# Patient Record
Sex: Male | Born: 1980 | Race: White | Hispanic: No | Marital: Single | Smoking: Never smoker
Health system: Southern US, Community
[De-identification: ages and names within clinical notes are randomized; demographics above are authoritative.]

## PROBLEM LIST (undated history)

## (undated) DIAGNOSIS — F419 Anxiety disorder, unspecified: Secondary | ICD-10-CM

## (undated) DIAGNOSIS — F429 Obsessive-compulsive disorder, unspecified: Secondary | ICD-10-CM

## (undated) DIAGNOSIS — F840 Autistic disorder: Secondary | ICD-10-CM

## (undated) HISTORY — DX: Obsessive-compulsive disorder, unspecified: F42.9

## (undated) HISTORY — PX: MANDIBLE FRACTURE SURGERY: SHX706

---

## 2013-07-07 ENCOUNTER — Ambulatory Visit: Payer: Self-pay | Admitting: *Deleted

## 2013-07-13 ENCOUNTER — Encounter: Payer: Self-pay | Admitting: *Deleted

## 2013-07-13 ENCOUNTER — Encounter: Payer: Medicaid Other | Attending: Family Medicine | Admitting: *Deleted

## 2013-07-13 VITALS — Ht 69.25 in | Wt 224.1 lb

## 2013-07-13 DIAGNOSIS — Z713 Dietary counseling and surveillance: Secondary | ICD-10-CM | POA: Insufficient documentation

## 2013-07-13 DIAGNOSIS — R635 Abnormal weight gain: Secondary | ICD-10-CM

## 2013-07-13 NOTE — Patient Instructions (Signed)
Plan: Continue eating your healthy meals and snacks each day Continue choosing to be active every day by walking, working out, dancing and playing sports

## 2013-07-13 NOTE — Progress Notes (Signed)
  Medical Nutrition Therapy:  Appt start time: 1030 end time:  1130.  Assessment:  Primary concerns today: patient here fior weight loss. He has already lost 9 pounds since MD appointment 2 months ago. He states he is working out at the group home and playing sports like Marketing executive and soccer. He is interested in doing karate too. He is lifting weights a couple of times a week, he has PS3 exercised too. Walks outside often too. He states the recent weight gain was due to Trileptal, which he is now taking in a suspension form and is not gaining weight anymore.  Preferred Learning Style:   No preference indicated   Learning Readiness:   Ready  Change in progress  MEDICATIONS: see list   DIETARY INTAKE:  24-hr recall:  B ( AM): 2 slices of Gingerbread toasted with Cream Cheese, banana OR unsweet cereal with milk OR oatmeal with fruit OR English muffin with fruit, coffee with sweetener and creamer Snk ( AM): none  L ( PM): frozen dinner OR sandwich and raw vegetables with fat free Ranch dressing, flavored sugar free water  Snk ( PM): fresh fruit D ( PM): meat, starch, vegetables, bread, occasionally salad, unsweet ice tea or favored water Snk ( PM): cookies OR rice cakes OR jello / pudding Beverages: coffee, flavored water  Usual physical activity: He is lifting weights a couple of times a week, he has PS3 exercised too. Walks outside often too.    Estimated energy needs: 1600 calories 180 g carbohydrates 120 g protein 44 g fat    Intervention:  Nutrition counseling and assessment completed. He currently has appropriate eating habits and is physically active. Reviewed rationale of Plate Method and continuing to enjoy his vegetables as well as moderate portions of lean meat, fruits and starches.  Plan: Continue eating your healthy meals and snacks each day Continue choosing to be active every day by walking, working out, dancing and playing sports  Teaching Method Utilized:  Visual, Auditory and Hands on  Handouts given during visit include:  Plate Method handout  Barriers to learning/adherence to lifestyle change: mentally handicapped but appears aware of healthy food choices  Demonstrated degree of understanding via:  Teach Back   Monitoring/Evaluation:  Dietary intake, exercise, and body weight prn.

## 2013-07-22 ENCOUNTER — Encounter: Payer: Self-pay | Admitting: *Deleted

## 2014-11-28 ENCOUNTER — Ambulatory Visit: Payer: Medicaid Other | Attending: Internal Medicine | Admitting: Occupational Therapy

## 2014-11-28 ENCOUNTER — Encounter: Payer: Self-pay | Admitting: Occupational Therapy

## 2014-11-28 DIAGNOSIS — F819 Developmental disorder of scholastic skills, unspecified: Secondary | ICD-10-CM | POA: Insufficient documentation

## 2014-11-28 NOTE — Therapy (Signed)
[facility] [address] [place], [place], [address] Phone: [phone]   Fax:  [phone]  Occupational Therapy Evaluation  Patient Details  Name: Jonathan Bartlett MRN: [id number] Date of Birth: 1981/05/20 Referring Provider:  Colon Branch, MD  Encounter Date: 11/28/2014      OT End of Session - 11/28/14 1050    Visit Number 1   Authorization Type MCD   OT Start Time 1015   OT Stop Time 1045   OT Time Calculation (min) 30 min   Activity Tolerance Patient tolerated treatment well      Past Medical History  Diagnosis Date  . OCD (obsessive compulsive disorder)     History reviewed. No pertinent past surgical history.  There were no vitals filed for this visit.  Visit Diagnosis:  Delay of cognitive development - Plan: Ot plan of care cert/re-cert      Subjective Assessment - 11/28/14 1025    Subjective  No changes   Patient is accompained by: --  Group Home aide   Pertinent History OCD, mild mental delay   Currently in Pain? 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 Autism)   Onset Date --  congential   Precautions   Precautions --  requires distant supervision   Balance Screen   Has the patient fallen in the past 6 months No   Has the patient had a decrease in activity level because of a fear of falling?  No   Is the patient reluctant to leave their home because of a fear of falling?  No   Home  Environment   Family/patient expects to be discharged to: Group home   Lives With --  [facility]   Prior Function   Level of Independence Independent with basic ADLs  supervision for IADLS   Vocation --  N/A   ADL   ADL comments Pt performing all BADLS independently, performing IADLS and job tasks within [employer] [facility] with supervision   Mobility   Mobility Status Independent   Written Expression   Dominant Hand  Right   Vision - History   Baseline Vision Wears glasses all the time   Cognition   Overall Cognitive Status History of cognitive impairments - at baseline  mild MR, can follow 1 step commands, answer appropriately   Coordination   9 Hole Peg Test Right;Left   Right 9 Hole Peg Test 36.22 sec. (due to mild difficulty following 2 step directions, 1st attempt doing this task)   Left 9 Hole Peg Test 30.81 sec   ROM / Strength   AROM / PROM / Strength AROM;Strength   AROM   Overall AROM Comments BUE AROM WNL's   Strength   Overall Strength Comments BUE MMT grossly 5/5   Hand Function   Right Hand Grip (lbs) 115 lbs   Left Hand Grip (lbs) 99 lbs                                     Plan - 11/28/14 1050    Clinical Impression Statement Pt is a 34 y.o. male who presents to outpatient rehab for yearly O.T. evaluation from [facility]. Pt with mild cognitive delay/mild MR with no new changes. Pt is highly functioning group home resident.  Pt will benefit from skilled therapeutic intervention in order to improve on the following deficits (Retired) --  N/A   OT Frequency One time visit  Evaluation only   OT Treatment/Interventions --  N/A   Plan No f/u O.T. recommended at this time          G-Codes - 11/28/14 1054    Functional Assessment Tool Used FIM   Functional Limitation Self care   Self Care Current Status ([id number](G8987) 0 percent impaired, limited or restricted   Self Care Goal Status ([id number](G8988) 0 percent impaired, limited or restricted   Self Care Discharge Status [phone]) 0 percent impaired, limited or restricted      Problem List There are no active problems to display for this patient.   Kelli ChurnBallie, Savita Runner Johnson, OTR/L 11/28/2014, 10:58 AM  [facility] [address] [place], [place], [address] Phone: [phone]   Fax:  [phone]

## 2015-11-21 ENCOUNTER — Emergency Department (HOSPITAL_COMMUNITY)
Admission: EM | Admit: 2015-11-21 | Discharge: 2015-11-21 | Disposition: A | Discharge status: Home / Self Care | Payer: Medicaid Other | Attending: Emergency Medicine | Admitting: Emergency Medicine

## 2015-11-21 ENCOUNTER — Emergency Department (HOSPITAL_COMMUNITY): Payer: Medicaid Other

## 2015-11-21 ENCOUNTER — Encounter (HOSPITAL_COMMUNITY): Payer: Self-pay

## 2015-11-21 DIAGNOSIS — W19XXXA Unspecified fall, initial encounter: Secondary | ICD-10-CM

## 2015-11-21 DIAGNOSIS — S299XXA Unspecified injury of thorax, initial encounter: Secondary | ICD-10-CM | POA: Diagnosis present

## 2015-11-21 DIAGNOSIS — Y92199 Unspecified place in other specified residential institution as the place of occurrence of the external cause: Secondary | ICD-10-CM | POA: Insufficient documentation

## 2015-11-21 DIAGNOSIS — S2232XA Fracture of one rib, left side, initial encounter for closed fracture: Secondary | ICD-10-CM | POA: Insufficient documentation

## 2015-11-21 DIAGNOSIS — Y939 Activity, unspecified: Secondary | ICD-10-CM | POA: Insufficient documentation

## 2015-11-21 DIAGNOSIS — Y999 Unspecified external cause status: Secondary | ICD-10-CM | POA: Diagnosis not present

## 2015-11-21 DIAGNOSIS — Z79899 Other long term (current) drug therapy: Secondary | ICD-10-CM | POA: Insufficient documentation

## 2015-11-21 MED ORDER — NAPROXEN 500 MG PO TABS: 500.0000 mg | ORAL_TABLET | Freq: Two times a day (BID) | ORAL | Status: DC

## 2015-11-21 NOTE — ED Notes (Signed)
Pt out of bed to bathroom gets out of bed slowly but when erect, pt ambulates with fluid motion and without change in facial expression
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
## 2015-11-21 NOTE — ED Notes (Signed)
Pt in xray

## 2015-11-21 NOTE — ED Notes (Signed)
Call to Jake SharkHarold, [facility] who estimates that he will be here by 2140

## 2015-11-21 NOTE — Discharge Instructions (Signed)
As discussed, following a rib fracture it is normal to have pain for several days, but unless he develops new, or concerning changes, such as fever, inability to breathe, your symptoms will improve.  Please use the provided incentive spirometry device, as directed, 4 times daily for the next week
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
## 2019-06-16 ENCOUNTER — Emergency Department (HOSPITAL_COMMUNITY): Payer: Medicaid Other

## 2019-06-16 ENCOUNTER — Emergency Department (HOSPITAL_COMMUNITY)
Admission: EM | Admit: 2019-06-16 | Discharge: 2019-06-16 | Disposition: A | Discharge status: Home / Self Care | Payer: Medicaid Other | Attending: Emergency Medicine | Admitting: Emergency Medicine

## 2019-06-16 ENCOUNTER — Other Ambulatory Visit: Payer: Self-pay

## 2019-06-16 ENCOUNTER — Encounter (HOSPITAL_COMMUNITY): Payer: Self-pay

## 2019-06-16 DIAGNOSIS — S99812A Other specified injuries of left ankle, initial encounter: Secondary | ICD-10-CM | POA: Diagnosis present

## 2019-06-16 DIAGNOSIS — Y9389 Activity, other specified: Secondary | ICD-10-CM | POA: Diagnosis not present

## 2019-06-16 DIAGNOSIS — Y92199 Unspecified place in other specified residential institution as the place of occurrence of the external cause: Secondary | ICD-10-CM | POA: Diagnosis not present

## 2019-06-16 DIAGNOSIS — S82852A Displaced trimalleolar fracture of left lower leg, initial encounter for closed fracture: Secondary | ICD-10-CM | POA: Diagnosis not present

## 2019-06-16 DIAGNOSIS — Z79899 Other long term (current) drug therapy: Secondary | ICD-10-CM | POA: Insufficient documentation

## 2019-06-16 DIAGNOSIS — S098XXA Other specified injuries of head, initial encounter: Secondary | ICD-10-CM | POA: Diagnosis not present

## 2019-06-16 DIAGNOSIS — Y999 Unspecified external cause status: Secondary | ICD-10-CM | POA: Diagnosis not present

## 2019-06-16 DIAGNOSIS — F840 Autistic disorder: Secondary | ICD-10-CM | POA: Insufficient documentation

## 2019-06-16 HISTORY — DX: Anxiety disorder, unspecified: F41.9

## 2019-06-16 HISTORY — DX: Autistic disorder: F84.0

## 2019-06-16 LAB — BASIC METABOLIC PANEL
Anion gap: 10 (ref 5–15)
BUN: 15 mg/dL (ref 6–20)
CO2: 28 mmol/L (ref 22–32)
Calcium: 9.3 mg/dL (ref 8.9–10.3)
Chloride: 103 mmol/L (ref 98–111)
Creatinine, Ser: 0.94 mg/dL (ref 0.61–1.24)
GFR calc Af Amer: >60 mL/min (ref >60)
GFR calc non Af Amer: >60 mL/min (ref >60)
Glucose, Bld: 79 mg/dL (ref 70–99)
Potassium: 4.2 mmol/L (ref 3.5–5.1)
Sodium: 141 mmol/L (ref 135–145)

## 2019-06-16 LAB — CBC WITH DIFFERENTIAL/PLATELET
Abs Immature Granulocytes: 0.04 10*3/uL (ref 0.00–0.07)
Basophils Absolute: 0 10*3/uL (ref 0.0–0.1)
Basophils Relative: 0 %
Eosinophils Absolute: 0.1 10*3/uL (ref 0.0–0.5)
Eosinophils Relative: 1 %
HCT: 46.9 % (ref 39.0–52.0)
Hemoglobin: 15.6 g/dL (ref 13.0–17.0)
Immature Granulocytes: 0 %
Lymphocytes Relative: 6 %
Lymphs Abs: 0.7 10*3/uL (ref 0.7–4.0)
MCH: 32.6 pg (ref 26.0–34.0)
MCHC: 33.3 g/dL (ref 30.0–36.0)
MCV: 97.9 fL (ref 80.0–100.0)
Monocytes Absolute: 0.8 10*3/uL (ref 0.1–1.0)
Monocytes Relative: 6 %
Neutro Abs: 11.7 10*3/uL — ABNORMAL HIGH (ref 1.7–7.7)
Neutrophils Relative %: 87 %
Platelets: 177 10*3/uL (ref 150–400)
RBC: 4.79 MIL/uL (ref 4.22–5.81)
RDW: 11.9 % (ref 11.5–15.5)
WBC: 13.4 10*3/uL — ABNORMAL HIGH (ref 4.0–10.5)
nRBC: 0 % (ref 0.0–0.2)

## 2019-06-16 MED ORDER — FENTANYL CITRATE (PF) 100 MCG/2ML IJ SOLN
50.0000 ug | Freq: Once | INTRAMUSCULAR | Status: AC
  Administered 2019-06-16: 13:00:00 50 ug via INTRAVENOUS
  Filled 2019-06-16: qty 2

## 2019-06-16 MED ORDER — OXYCODONE-ACETAMINOPHEN 5-325 MG PO TABS
1.0000 | ORAL_TABLET | Freq: Once | ORAL | Status: AC
Start: 2019-06-16 — End: 2019-06-16
  Administered 2019-06-16: 14:00:00 1 via ORAL
  Filled 2019-06-16: qty 1

## 2019-06-16 MED ORDER — OXYCODONE-ACETAMINOPHEN 5-325 MG PO TABS: 1.0000 | ORAL_TABLET | ORAL | 0 refills | Status: DC | PRN

## 2019-06-16 MED ORDER — ETOMIDATE 2 MG/ML IV SOLN
10.0000 mg | Freq: Once | INTRAVENOUS | Status: AC
  Administered 2019-06-16: 10 mg via INTRAVENOUS
  Filled 2019-06-16: qty 10

## 2019-06-16 NOTE — ED Notes (Signed)
Pt is aware we need urine sample.  

## 2019-06-16 NOTE — ED Provider Notes (Signed)
Physical Exam  BP 101/72   Pulse 95   Temp 98 F (36.7 C)   Resp (!) 21   Ht 5\' 10"  (1.778 m)   Wt 77.1 kg   SpO2 100%   BMI 24.39 kg/m   Physical Exam  ED Course/Procedures     .Splint Application  Date/Time: 06/16/2019 3:21 PM Performed by: Isla Pence, MD Authorized by: Isla Pence, MD   Consent:    Consent obtained:  Written   Consent given by:  Guardian   Risks discussed:  Discoloration, numbness, pain and swelling   Alternatives discussed:  No treatment Pre-procedure details:    Sensation:  Normal Procedure details:    Laterality:  Left   Location:  Ankle   Ankle:  L ankle   Splint type:  Sugar tong   Supplies:  Cotton padding, Ortho-Glass and elastic bandage Post-procedure details:    Pain:  Improved   Sensation:  Normal   Patient tolerance of procedure:  Tolerated well, no immediate complications .Splint Application  Date/Time: 06/16/2019 3:21 PM Performed by: Isla Pence, MD Authorized by: Isla Pence, MD   Consent:    Consent obtained:  Written   Consent given by:  Guardian   Risks discussed:  Discoloration, numbness, pain and swelling   Alternatives discussed:  No treatment Pre-procedure details:    Sensation:  Normal Procedure details:    Laterality:  Left   Location:  Ankle   Ankle:  L ankle   Splint type:  Short leg   Supplies:  Elastic bandage, cotton padding and Ortho-Glass Post-procedure details:    Pain:  Improved   Sensation:  Normal   Patient tolerance of procedure:  Tolerated well, no immediate complications Reduction of fracture  Date/Time: 06/16/2019 3:22 PM Performed by: Isla Pence, MD Authorized by: Isla Pence, MD  Consent: Written consent obtained. Consent given by: guardian Patient understanding: patient states understanding of the procedure being performed Patient identity confirmed: verbally with patient Time out: Immediately prior to procedure a "time out" was called to verify the correct  patient, procedure, equipment, support staff and site/side marked as required. Preparation: Patient was prepped and draped in the usual sterile fashion. Local anesthesia used: no  Anesthesia: Local anesthesia used: no  Sedation: Patient sedated: yes Sedatives: fentanyl and etomidate Vitals: Vital signs were monitored during sedation.  Patient tolerance: patient tolerated the procedure well with no immediate complications  Reduction of dislocation  Date/Time: 06/16/2019 3:22 PM Performed by: Isla Pence, MD Authorized by: Isla Pence, MD  Consent: Written consent obtained. Consent given by: guardian Patient understanding: patient states understanding of the procedure being performed Required items: required blood products, implants, devices, and special equipment available Patient identity confirmed: verbally with patient Time out: Immediately prior to procedure a "time out" was called to verify the correct patient, procedure, equipment, support staff and site/side marked as required. Local anesthesia used: no  Anesthesia: Local anesthesia used: no  Sedation: Patient sedated: yes Sedatives: fentanyl and etomidate  Patient tolerance: patient tolerated the procedure well with no immediate complications  .Sedation  Date/Time: 06/16/2019 3:23 PM Performed by: Isla Pence, MD Authorized by: Isla Pence, MD   Consent:    Consent obtained:  Written   Consent given by:  Guardian   Alternatives discussed:  Analgesia without sedation Universal protocol:    Immediately prior to procedure a time out was called: yes     Patient identity confirmation method:  Verbally with patient Pre-sedation assessment:    Time since last food  or drink:  5   ASA classification: class 2 - patient with mild systemic disease     Neck mobility: normal     Mallampati score:  II - soft palate, uvula, fauces visible   Pre-sedation assessments completed and reviewed: airway patency,  cardiovascular function, hydration status, mental status, nausea/vomiting, pain level, respiratory function and temperature   Immediate pre-procedure details:    Reassessment: Patient reassessed immediately prior to procedure     Reviewed: vital signs     Verified: bag valve mask available, emergency equipment available, intubation equipment available, IV patency confirmed, oxygen available and reversal medications available   Procedure details (see MAR for exact dosages):    Preoxygenation:  Room air   Sedation:  Etomidate   Intended level of sedation: deep   Analgesia:  Fentanyl   Intra-procedure monitoring:  Blood pressure monitoring, cardiac monitor, continuous capnometry, continuous pulse oximetry, frequent LOC assessments and frequent vital sign checks   Intra-procedure events: none     Total Provider sedation time (minutes):  30 Post-procedure details:    Attendance: Constant attendance by certified staff until patient recovered     Recovery: Patient returned to pre-procedure baseline     Patient is stable for discharge or admission: yes     Patient tolerance:  Tolerated well, no immediate complications    MDM         Jacalyn Lefevre, MD 06/16/19 1524

## 2019-06-16 NOTE — ED Notes (Signed)
Pt's guardian is Otelia Santee- step dad- [phone]
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
## 2019-06-16 NOTE — Sedation Documentation (Signed)
Vital signs stable. 

## 2019-06-16 NOTE — ED Notes (Signed)
Discharge instructions given to [facility] group home by [place], [place] at this time. Group home staff member to return to ED to transport patient back today.
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
## 2019-06-16 NOTE — Sedation Documentation (Signed)
ED Provider at bedside. 

## 2019-06-16 NOTE — Discharge Instructions (Addendum)
No weightbearing to your left leg.  Use your crutches for walking and standing.  Keep your leg elevated and keep your splint dry.  Dr. Aline Brochure will see you in his office tomorrow, Friday, 06/17/2019.  You may call the office to arrange appointment time.

## 2019-06-17 ENCOUNTER — Telehealth: Payer: Self-pay | Admitting: Orthopedic Surgery

## 2019-06-17 ENCOUNTER — Ambulatory Visit (INDEPENDENT_AMBULATORY_CARE_PROVIDER_SITE_OTHER): Payer: Medicaid Other | Admitting: Orthopedic Surgery

## 2019-06-17 ENCOUNTER — Telehealth: Payer: Self-pay | Admitting: Radiology

## 2019-06-17 ENCOUNTER — Encounter: Payer: Self-pay | Admitting: Orthopedic Surgery

## 2019-06-17 VITALS — BP 118/75 | HR 77 | Ht 70.0 in | Wt 170.0 lb

## 2019-06-17 DIAGNOSIS — S82842A Displaced bimalleolar fracture of left lower leg, initial encounter for closed fracture: Secondary | ICD-10-CM

## 2019-06-17 NOTE — Telephone Encounter (Signed)
Dr Aline Brochure called his step father.

## 2019-06-17 NOTE — Patient Instructions (Signed)
Displaced Bimalleolar Ankle Fracture Treated With ORIF A bimalleolar fracture is two breaks (fractures) in the lower bones of the leg that help to form the ankle. These fractures are in:  The bottom end of the bone that you feel as the bump on the outer side of your ankle (fibula).  The bottom end of the bone that you feel as the bump on the inner side of your ankle (tibia). Open reduction with internal fixation (ORIF) is a surgical procedure that may be used to treat a bimalleolar fracture. You may need this surgery if your fracture is displaced, which means that the bones are not lined up correctly. During ORIF, a surgeon will move the bones back into the right position. The surgeon will put in a combination of screws, metal plates, or different types of wiring to hold the bones in place. Tell a health care provider about:  Any allergies you have.  All medicines you are taking, including vitamins, herbs, eye drops, creams, and over-the-counter medicines.  Any problems you or family members have had with anesthetic medicines.  Any blood disorders you have.  Any surgeries you have had.  Any medical conditions you have.  Whether you are pregnant or may be pregnant. What are the risks? Generally, this is a safe procedure. However, problems may occur, including:  Excessive bleeding.  Infection.  Allergic reactions to medicines.  Damage to nerves or blood vessels.  Failure of the fracture to heal.  Long-term pain and stiffness (arthritis).  Stiffness in the ankle after the repair. What happens before the procedure? Staying hydrated Follow instructions from your health care provider about hydration, which may include:  Up to 2 hours before the procedure - you may continue to drink clear liquids, such as water, clear fruit juice, black coffee, and plain tea. Eating and drinking restrictions Follow instructions from your health care provider about eating and drinking, which may  include:  8 hours before the procedure - stop eating heavy meals or foods such as meat, fried foods, or fatty foods.  6 hours before the procedure - stop eating light meals or foods, such as toast or cereal.  6 hours before the procedure - stop drinking milk or drinks that contain milk.  2 hours before the procedure - stop drinking clear liquids. Medicines  Ask your health care provider about: ? Changing or stopping your regular medicines. This is especially important if you are taking diabetes medicines or blood thinners. ? Taking medicines such as aspirin and ibuprofen. These medicines can thin your blood. Do not take these medicines unless your health care provider tells you to take them. ? Taking over-the-counter medicines, vitamins, herbs, and supplements.  You may be given antibiotic medicine to help prevent infection. General instructions  Plan to have someone take you home from the hospital or clinic.  Plan to have a responsible adult care for you for at least 24 hours after you leave the hospital or clinic. This is important.  Ask your health care provider how your surgical site will be marked or identified.  You may be asked to shower with a germ-killing soap. What happens during the procedure?  To lower your risk of infection: ? Your health care team will wash or sanitize their hands. ? Hair may be removed from the surgical area. ? Your skin will be washed with soap.  An IV will be inserted into one of your veins. You may be given antibiotic medicine and medicine for pain through the   IV.  You will be given one or more of the following: ? A medicine to numb the area (local anesthetic). ? A medicine to make you fall asleep (general anesthetic). ? A medicine that is injected into your spine to numb the area below and slightly above the injection site (spinal anesthetic). ? A medicine that is injected into an area of your body to numb everything below the injection site  (regional anesthetic).  The surgeon will make an incision through your skin over the area of the fractures.  The broken bones will be put back into their normal positions. The surgeon will use a combination of screws, metal plates, or different types of wiring to hold the bones in place.  After the bones are back in place, the surgeon will close the incision using stitches (sutures) or staples.  A bandage (dressing) and a splint, cast, or supportive boot will be placed over your ankle. The procedure may vary among health care providers and hospitals. What happens after the procedure?  Your blood pressure, heart rate, breathing rate, and blood oxygen level will be monitored until the medicines you were given have worn off.  You will be given medicine for pain as needed.  You may be given instructions about how much body weight you can or cannot safely support (bear) on your injured foot (weight-bearing restrictions). You may be given crutches, a cane, or a walker to help you move around so that you do not bear any weight on your foot.  While in the hospital, you will be helped out of bed so you can begin moving around. This is important to improve blood flow and breathing.  Do not drive for 24 hours if you were given a medicine to help you relax (sedative) during your procedure. Summary  A bimalleolar fracture is two breaks (fractures) in the lower bones of the leg (fibula and tibia) that help to form the ankle.  To repair the fractures with ORIF surgery, the surgeon will make an incision over the bones and move them back into place.  A combination of screws, metal plates, or wires will be used to permanently hold the bones in place.  Plan to have someone take you home after the procedure. Also, plan to have a responsible adult care for you for at least 24 hours after you leave the hospital or clinic. This information is not intended to replace advice given to you by your health care  provider. Make sure you discuss any questions you have with your health care provider. Document Released: 04/30/2005 Document Revised: 07/03/2017 Document Reviewed: 05/01/2017 Elsevier Patient Education  2020 [employer]  

## 2019-06-17 NOTE — Telephone Encounter (Addendum)
Step father wants you to call him about surgery, [phone] is phone number   ORIF ankle fracture 06/23/2019

## 2019-06-17 NOTE — Addendum Note (Signed)
Addended byCandice Camp on: 06/17/2019 10:58 AM   Modules accepted: Orders, SmartSet

## 2019-06-17 NOTE — Telephone Encounter (Signed)
The procedure has been fully reviewed with the patient; The risks and benefits of surgery have been discussed and explained and understood. Alternative treatment has also been reviewed, questions were encouraged and answered. The postoperative plan is also been reviewed.  I spoke to the guardian we discussed the surgery risk of infection risk of hardware removal  6 weeks of casting followed by 6 weeks of Cam walker up to 1 year of time needed for recovery  Guardian agrees to proceed with surgery
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
## 2019-06-21 ENCOUNTER — Other Ambulatory Visit (HOSPITAL_COMMUNITY)
Admission: RE | Admit: 2019-06-21 | Discharge: 2019-06-21 | Disposition: A | Discharge status: Home / Self Care | Payer: Medicaid Other | Source: Ambulatory Visit | Attending: Orthopedic Surgery | Admitting: Orthopedic Surgery

## 2019-06-21 ENCOUNTER — Encounter (HOSPITAL_COMMUNITY)
Admission: RE | Admit: 2019-06-21 | Discharge: 2019-06-21 | Disposition: A | Discharge status: Home / Self Care | Payer: Medicaid Other | Source: Ambulatory Visit | Attending: Orthopedic Surgery | Admitting: Orthopedic Surgery

## 2019-06-21 ENCOUNTER — Other Ambulatory Visit: Payer: Self-pay

## 2019-06-21 DIAGNOSIS — Z01812 Encounter for preprocedural laboratory examination: Secondary | ICD-10-CM | POA: Diagnosis present

## 2019-06-21 DIAGNOSIS — Z20828 Contact with and (suspected) exposure to other viral communicable diseases: Secondary | ICD-10-CM | POA: Insufficient documentation

## 2019-06-21 LAB — SARS CORONAVIRUS 2 (TAT 6-24 HRS): SARS Coronavirus 2: NEGATIVE

## 2019-06-22 ENCOUNTER — Encounter: Payer: Self-pay | Admitting: Orthopedic Surgery

## 2019-06-22 ENCOUNTER — Ambulatory Visit (INDEPENDENT_AMBULATORY_CARE_PROVIDER_SITE_OTHER): Payer: Medicaid Other | Admitting: Orthopedic Surgery

## 2019-06-22 ENCOUNTER — Other Ambulatory Visit: Payer: Self-pay

## 2019-06-22 DIAGNOSIS — S82842D Displaced bimalleolar fracture of left lower leg, subsequent encounter for closed fracture with routine healing: Secondary | ICD-10-CM
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
## 2019-06-22 NOTE — Patient Instructions (Signed)
Continue with instructions to be at the hospital tomorrow for surgery
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
## 2019-06-23 ENCOUNTER — Other Ambulatory Visit (HOSPITAL_COMMUNITY): Payer: Medicaid Other

## 2019-06-23 ENCOUNTER — Ambulatory Visit (HOSPITAL_COMMUNITY): Payer: Medicaid Other

## 2019-06-23 ENCOUNTER — Encounter (HOSPITAL_COMMUNITY)
Admission: RE | Disposition: A | Discharge status: Home / Self Care | Payer: Self-pay | Source: Home / Self Care | Attending: Orthopedic Surgery

## 2019-06-23 ENCOUNTER — Ambulatory Visit (HOSPITAL_COMMUNITY)
Admission: RE | Admit: 2019-06-23 | Discharge: 2019-06-23 | Disposition: A | Discharge status: Home / Self Care | Payer: Medicaid Other | Attending: Orthopedic Surgery | Admitting: Orthopedic Surgery

## 2019-06-23 ENCOUNTER — Ambulatory Visit (HOSPITAL_COMMUNITY): Payer: Medicaid Other | Admitting: Anesthesiology

## 2019-06-23 ENCOUNTER — Encounter (HOSPITAL_COMMUNITY): Payer: Self-pay | Admitting: Anesthesiology

## 2019-06-23 DIAGNOSIS — S82892D Other fracture of left lower leg, subsequent encounter for closed fracture with routine healing: Secondary | ICD-10-CM

## 2019-06-23 DIAGNOSIS — F419 Anxiety disorder, unspecified: Secondary | ICD-10-CM | POA: Diagnosis not present

## 2019-06-23 DIAGNOSIS — F840 Autistic disorder: Secondary | ICD-10-CM | POA: Diagnosis not present

## 2019-06-23 DIAGNOSIS — S82892A Other fracture of left lower leg, initial encounter for closed fracture: Secondary | ICD-10-CM

## 2019-06-23 DIAGNOSIS — X58XXXA Exposure to other specified factors, initial encounter: Secondary | ICD-10-CM | POA: Insufficient documentation

## 2019-06-23 DIAGNOSIS — Z91030 Bee allergy status: Secondary | ICD-10-CM | POA: Diagnosis not present

## 2019-06-23 DIAGNOSIS — S82842A Displaced bimalleolar fracture of left lower leg, initial encounter for closed fracture: Secondary | ICD-10-CM | POA: Insufficient documentation

## 2019-06-23 DIAGNOSIS — Z79899 Other long term (current) drug therapy: Secondary | ICD-10-CM | POA: Insufficient documentation

## 2019-06-23 HISTORY — PX: ORIF ANKLE FRACTURE: SHX5408

## 2019-06-23 SURGERY — OPEN REDUCTION INTERNAL FIXATION (ORIF) ANKLE FRACTURE
Anesthesia: General | Site: Ankle | Laterality: Left

## 2019-06-23 MED ORDER — MIDAZOLAM HCL 5 MG/5ML IJ SOLN
INTRAMUSCULAR | Status: DC | PRN
  Administered 2019-06-23: 2 mg via INTRAVENOUS

## 2019-06-23 MED ORDER — PROPOFOL 10 MG/ML IV BOLUS
INTRAVENOUS | Status: AC
  Filled 2019-06-23: qty 20

## 2019-06-23 MED ORDER — MEPERIDINE HCL 50 MG/ML IJ SOLN: 6.2500 mg | INTRAMUSCULAR | Status: DC | PRN

## 2019-06-23 MED ORDER — MIDAZOLAM HCL 2 MG/2ML IJ SOLN: 2.0000 mg | Freq: Once | INTRAMUSCULAR | Status: DC

## 2019-06-23 MED ORDER — ONDANSETRON HCL 4 MG/2ML IJ SOLN
4.0000 mg | Freq: Once | INTRAMUSCULAR | Status: AC | PRN
  Administered 2019-06-23: 4 mg via INTRAVENOUS
  Filled 2019-06-23: qty 2

## 2019-06-23 MED ORDER — DEXAMETHASONE SODIUM PHOSPHATE 4 MG/ML IJ SOLN
INTRAMUSCULAR | Status: AC
  Filled 2019-06-23: qty 2

## 2019-06-23 MED ORDER — DEXAMETHASONE SODIUM PHOSPHATE 10 MG/ML IJ SOLN
INTRAMUSCULAR | Status: DC | PRN
  Administered 2019-06-23: 10 mg via INTRAVENOUS

## 2019-06-23 MED ORDER — ONDANSETRON HCL 4 MG/2ML IJ SOLN
INTRAMUSCULAR | Status: DC | PRN
  Administered 2019-06-23: 4 mg via INTRAVENOUS

## 2019-06-23 MED ORDER — PHENYLEPHRINE HCL (PRESSORS) 10 MG/ML IV SOLN
INTRAVENOUS | Status: DC | PRN
  Administered 2019-06-23 (×2): 80 ug via INTRAVENOUS

## 2019-06-23 MED ORDER — BUPIVACAINE HCL (PF) 0.5 % IJ SOLN
INTRAMUSCULAR | Status: DC | PRN
  Administered 2019-06-23: 29 mL via PERINEURAL

## 2019-06-23 MED ORDER — FENTANYL CITRATE (PF) 250 MCG/5ML IJ SOLN
INTRAMUSCULAR | Status: AC
  Filled 2019-06-23: qty 5

## 2019-06-23 MED ORDER — LIDOCAINE HCL (PF) 1 % IJ SOLN
INTRAMUSCULAR | Status: DC | PRN
  Administered 2019-06-23: 3 mL

## 2019-06-23 MED ORDER — BUPIVACAINE HCL (PF) 0.5 % IJ SOLN
INTRAMUSCULAR | Status: AC
  Filled 2019-06-23: qty 30

## 2019-06-23 MED ORDER — FENTANYL CITRATE (PF) 100 MCG/2ML IJ SOLN: 100.0000 ug | Freq: Once | INTRAMUSCULAR | Status: DC

## 2019-06-23 MED ORDER — BUPIVACAINE-EPINEPHRINE (PF) 0.25% -1:200000 IJ SOLN
INTRAMUSCULAR | Status: AC
  Filled 2019-06-23: qty 60

## 2019-06-23 MED ORDER — BUPIVACAINE-EPINEPHRINE (PF) 0.25% -1:200000 IJ SOLN
INTRAMUSCULAR | Status: AC
  Filled 2019-06-23: qty 30

## 2019-06-23 MED ORDER — DEXAMETHASONE SODIUM PHOSPHATE 4 MG/ML IJ SOLN
INTRAMUSCULAR | Status: DC | PRN
  Administered 2019-06-23 (×2): 4 mg via PERINEURAL

## 2019-06-23 MED ORDER — CEFAZOLIN SODIUM-DEXTROSE 2-4 GM/100ML-% IV SOLN
INTRAVENOUS | Status: AC
  Filled 2019-06-23: qty 100

## 2019-06-23 MED ORDER — FENTANYL CITRATE (PF) 100 MCG/2ML IJ SOLN
INTRAMUSCULAR | Status: DC | PRN
  Administered 2019-06-23: 150 ug via INTRAVENOUS
  Administered 2019-06-23: 50 ug via INTRAVENOUS

## 2019-06-23 MED ORDER — LIDOCAINE 2% (20 MG/ML) 5 ML SYRINGE
INTRAMUSCULAR | Status: AC
  Filled 2019-06-23: qty 5

## 2019-06-23 MED ORDER — OXYCODONE-ACETAMINOPHEN 7.5-325 MG PO TABS: 1.0000 | ORAL_TABLET | ORAL | 0 refills | Status: AC | PRN

## 2019-06-23 MED ORDER — BUPIVACAINE-EPINEPHRINE (PF) 0.25% -1:200000 IJ SOLN
INTRAMUSCULAR | Status: DC | PRN
  Administered 2019-06-23: 17 mL via PERINEURAL

## 2019-06-23 MED ORDER — CHLORHEXIDINE GLUCONATE 4 % EX LIQD: 60.0000 mL | Freq: Once | CUTANEOUS | Status: DC

## 2019-06-23 MED ORDER — CEFAZOLIN SODIUM-DEXTROSE 2-4 GM/100ML-% IV SOLN
2.0000 g | INTRAVENOUS | Status: AC
  Administered 2019-06-23: 12:00:00 2 g via INTRAVENOUS

## 2019-06-23 MED ORDER — HYDROMORPHONE HCL 1 MG/ML IJ SOLN
0.2500 mg | INTRAMUSCULAR | Status: DC | PRN
  Administered 2019-06-23: 0.5 mg via INTRAVENOUS
  Filled 2019-06-23: qty 0.5

## 2019-06-23 MED ORDER — LIDOCAINE HCL (PF) 1 % IJ SOLN
INTRAMUSCULAR | Status: AC
  Filled 2019-06-23: qty 30

## 2019-06-23 MED ORDER — 0.9 % SODIUM CHLORIDE (POUR BTL) OPTIME
TOPICAL | Status: DC | PRN
  Administered 2019-06-23: 1000 mL

## 2019-06-23 MED ORDER — MIDAZOLAM HCL 2 MG/2ML IJ SOLN
INTRAMUSCULAR | Status: AC
  Filled 2019-06-23: qty 2

## 2019-06-23 MED ORDER — PROPOFOL 10 MG/ML IV BOLUS
INTRAVENOUS | Status: DC | PRN
  Administered 2019-06-23: 200 mg via INTRAVENOUS
  Administered 2019-06-23: 150 mg via INTRAVENOUS

## 2019-06-23 MED ORDER — LACTATED RINGERS IV SOLN
Freq: Once | INTRAVENOUS | Status: AC
  Administered 2019-06-23: 12:00:00 via INTRAVENOUS

## 2019-06-23 MED ORDER — IBUPROFEN 800 MG PO TABS: 800.0000 mg | ORAL_TABLET | Freq: Three times a day (TID) | ORAL | 0 refills | Status: DC | PRN

## 2019-06-23 SURGICAL SUPPLY — 55 items
BANDAGE ELASTIC 4 VELCRO NS (GAUZE/BANDAGES/DRESSINGS) ×6 IMPLANT
BANDAGE ESMARK 4X12 BL STRL LF (DISPOSABLE) ×1 IMPLANT
BLADE SURG SZ10 CARB STEEL (BLADE) ×3 IMPLANT
BNDG COHESIVE 4X5 TAN STRL (GAUZE/BANDAGES/DRESSINGS) ×3 IMPLANT
BNDG ELASTIC 3X5.8 VLCR NS LF (GAUZE/BANDAGES/DRESSINGS) ×3 IMPLANT
BNDG ELASTIC 4X5.8 VLCR NS LF (GAUZE/BANDAGES/DRESSINGS) ×6 IMPLANT
BNDG ESMARK 4X12 BLUE STRL LF (DISPOSABLE) ×3
CHLORAPREP W/TINT 26 (MISCELLANEOUS) ×6 IMPLANT
CLOTH BEACON ORANGE TIMEOUT ST (SAFETY) ×3 IMPLANT
COVER LIGHT HANDLE STERIS (MISCELLANEOUS) ×6 IMPLANT
COVER WAND RF STERILE (DRAPES) ×3 IMPLANT
CUFF TOURN SGL QUICK 34 (TOURNIQUET CUFF) ×2
CUFF TRNQT CYL 34X4.125X (TOURNIQUET CUFF) ×1 IMPLANT
DECANTER SPIKE VIAL GLASS SM (MISCELLANEOUS) ×6 IMPLANT
DRAPE C-ARM FOLDED MOBILE STRL (DRAPES) ×3 IMPLANT
DRAPE HALF SHEET 40X57 (DRAPES) ×3 IMPLANT
DRILL 2.6X122MM WL AO SHAFT (BIT) ×3 IMPLANT
GAUZE SPONGE 4X4 12PLY STRL (GAUZE/BANDAGES/DRESSINGS) ×3 IMPLANT
GAUZE XEROFORM 5X9 LF (GAUZE/BANDAGES/DRESSINGS) ×3 IMPLANT
GLOVE BIOGEL PI IND STRL 7.0 (GLOVE) ×2 IMPLANT
GLOVE BIOGEL PI INDICATOR 7.0 (GLOVE) ×4
GLOVE ECLIPSE 6.5 STRL STRAW (GLOVE) ×9 IMPLANT
GLOVE SKINSENSE NS SZ8.0 LF (GLOVE) ×2
GLOVE SKINSENSE STRL SZ8.0 LF (GLOVE) ×1 IMPLANT
GLOVE SS N UNI LF 8.5 STRL (GLOVE) ×3 IMPLANT
GOWN STRL REUS W/TWL LRG LVL3 (GOWN DISPOSABLE) ×6 IMPLANT
GOWN STRL REUS W/TWL XL LVL3 (GOWN DISPOSABLE) ×3 IMPLANT
INST SET MINOR BONE (KITS) ×3 IMPLANT
K-WIRE 1.6X150 (WIRE) ×2
K-WIRE FX150X1.6XKRSH (WIRE) ×2
KIT TURNOVER KIT A (KITS) ×3 IMPLANT
KWIRE ×6 IMPLANT
KWIRE FX150X1.6XKRSH (WIRE) ×2 IMPLANT
MANIFOLD NEPTUNE II (INSTRUMENTS) ×3 IMPLANT
NEEDLE HYPO 21X1.5 SAFETY (NEEDLE) ×3 IMPLANT
NS IRRIG 1000ML POUR BTL (IV SOLUTION) ×3 IMPLANT
PACK BASIC LIMB (CUSTOM PROCEDURE TRAY) ×3 IMPLANT
PAD ABD 5X9 TENDERSORB (GAUZE/BANDAGES/DRESSINGS) ×6 IMPLANT
PAD ARMBOARD 7.5X6 YLW CONV (MISCELLANEOUS) ×3 IMPLANT
PAD CAST 4YDX4 CTTN HI CHSV (CAST SUPPLIES) ×1 IMPLANT
PADDING CAST COTTON 4X4 STRL (CAST SUPPLIES) ×2
PLATE 7H 96MM (Plate) ×3 IMPLANT
SCREW 3.5X10MM (Screw) ×3 IMPLANT
SCREW BONE 3.5X16MM (Screw) ×3 IMPLANT
SCREW BONE 3.5X20MM (Screw) ×3 IMPLANT
SCREW BONE NON-LCKING 3.5X12MM (Screw) ×9 IMPLANT
SET BASIN LINEN APH (SET/KITS/TRAYS/PACK) ×3 IMPLANT
SPLINT J IMMOBILIZER 4X20FT (CAST SUPPLIES) ×1 IMPLANT
SPLINT J PLASTER J 4INX20Y (CAST SUPPLIES) ×2
SPONGE LAP 18X18 RF (DISPOSABLE) ×6 IMPLANT
STAPLER VISISTAT 35W (STAPLE) ×3 IMPLANT
SUT MON AB 0 CT1 (SUTURE) ×3 IMPLANT
SYR 30ML LL (SYRINGE) ×3 IMPLANT
SYR BULB IRRIGATION 50ML (SYRINGE) ×3 IMPLANT
WATER STERILE IRR 500ML POUR (IV SOLUTION) ×3 IMPLANT

## 2019-06-23 NOTE — Brief Op Note (Signed)
06/23/2019  1:43 PM  PATIENT:  Jonathan Bartlett  38 y.o. male  PRE-OPERATIVE DIAGNOSIS:  left ankle fracture bimolleolar  POST-OPERATIVE DIAGNOSIS:, Bimalleolar left ankle fracture  Stryker implants lateral plate and 6 screws medial K wires times two 1.6  Assisted by [facility] anesthesia with 2 blocks 1 posterior and 1 lateral  EBL minimal  Blood administered none  No drains  No extra injections were performed  PROCEDURE:  Procedure(s): OPEN REDUCTION INTERNAL FIXATION (ORIF) ANKLE FRACTURE (Left)  SURGEON:  Surgeon(s) and Role:    Carole Civil, MD - Primary  No specimens  Count correct  Tourniquet time 48 minutes   DICTATION: .Dragon Dictation  PLAN OF CARE: Discharge to home after PACU  PATIENT DISPOSITION:  PACU - hemodynamically stable.   Delay start of Pharmacological VTE agent (>24hrs) due to surgical blood loss or risk of bleeding: not applicable  Surgery was done as follows  The patient was seen in preop the surgical site was confirmed and marked his left ankle  Patient was taken to surgery had a block placed by anesthesia 1 posterior and 1 medially.  This was done after general anesthesia  He was in the supine position with 2 sandbags under the left hip for internal rotation of the leg and 3 blankets placed under the operative extremity  After sterile prep and drape timeout was completed limb was then exsanguinated with 4 inch Esmarch tourniquet inflated to 300 mmHg  The incision was made laterally over the malleolus and taken down to bone creating full-thickness skin flaps.  The fracture was irrigated debrided manually reduced and held with a pointed reduction clamp after which radiographs were taken.  Ankle mortise was restored as was length of the fibula.  Fracture was also reduced anatomically.  We then contoured a one third tubular plate placed at laterally and placed 6 screws starting closest to the fracture site on each hand  and then progressing away from the fracture  Radiographs confirm the reduction the ankle mortise restoration and the overall length of the fibula  We then turned our attention medially we made a straight incision over the medial fracture which was very small the subcutaneous tissue was divided the fracture site was debrided the ankle joint was irrigated the fracture was manually reduced and held with a pointed reduction clamp after which radiograph confirmed its reduction  We placed 2, 1.6 mm K wires  We checked x-ray confirmed reduction of the medial malleolar fracture  We then backed the pin out and then bent it and drove it back in and did the same with the second pin  We took x-rays and AP lateral and oblique in the fracture sites were reduced and the hardware was in good position  Both wounds were irrigated copiously with saline the medial wound was closed with 0 Monocryl and staples the lateral wound was closed with 0 Monocryl and staples  The tourniquet was then released   Good color and capillary refill noted dressings were applied sugar tong splint was applied foot was in neutral position at the ankle joint  Extubation and returned to recovery room in stable condition  3 weeks of no weightbearing in a cast and then convert to ankle CAM Walker  At 6 weeks he can weight-bear as tolerated Total time in the cam walker 12 weeks

## 2019-06-23 NOTE — Transfer of Care (Signed)
Immediate Anesthesia Transfer of Care Note  Patient: Jonathan Bartlett  Procedure(s) Performed: OPEN REDUCTION INTERNAL FIXATION (ORIF) ANKLE FRACTURE (Left Ankle)  Patient Location: PACU  Anesthesia Type:GA combined with regional for post-op pain  Level of Consciousness: awake, alert , oriented and patient cooperative  Airway & Oxygen Therapy: Patient Spontanous Breathing and Patient connected to face mask oxygen  Post-op Assessment: Report given to RN and Post -op Vital signs reviewed and stable  Post vital signs: Reviewed and stable  Last Vitals:  Vitals Value Taken Time  BP 124/89 06/23/19 1400  Temp    Pulse 91 06/23/19 1400  Resp 22 06/23/19 1400  SpO2 100 % 06/23/19 1400  Vitals shown include unvalidated device data.  Last Pain:  Vitals:   06/23/19 1100  TempSrc: Oral  PainSc: 0-No pain      Patients Stated Pain Goal: 4 (40/97/35 3299)  Complications: No apparent anesthesia complications

## 2019-06-23 NOTE — Discharge Instructions (Signed)
PATIENT INSTRUCTIONS POST-ANESTHESIA  IMMEDIATELY FOLLOWING SURGERY:  Do not drive or operate machinery for the first twenty four hours after surgery.  Do not make any important decisions for twenty four hours after surgery or while taking narcotic pain medications or sedatives.  If you develop intractable nausea and vomiting or a severe headache please notify your doctor immediately.  FOLLOW-UP:  Please make an appointment with your surgeon as instructed. You do not need to follow up with anesthesia unless specifically instructed to do so.  WOUND CARE INSTRUCTIONS (if applicable):  Keep a dry clean dressing on the anesthesia/puncture wound site if there is drainage.  Once the wound has quit draining you may leave it open to air.  Generally you should leave the bandage intact for twenty four hours unless there is drainage.  If the epidural site drains for more than 36-48 hours please call the anesthesia department.  QUESTIONS?:  Please feel free to call your physician or the hospital operator if you have any questions, and they will be happy to assist you.      Wound Care, Adult Taking care of your wound properly can help to prevent pain, infection, and scarring. It can also help your wound to heal more quickly. How to care for your wound Wound care      Follow instructions from your health care provider about how to take care of your wound. Make sure you: ? Wash your hands with soap and water before you change the bandage (dressing). If soap and water are not available, use hand sanitizer. ? Change your dressing as told by your health care provider. ? Leave stitches (sutures), skin glue, or adhesive strips in place. These skin closures may need to stay in place for 2 weeks or longer. If adhesive strip edges start to loosen and curl up, you may trim the loose edges. Do not remove adhesive strips completely unless your health care provider tells you to do that.  Check your wound area every  day for signs of infection. Check for: ? Redness, swelling, or pain. ? Fluid or blood. ? Warmth. ? Pus or a bad smell.  Ask your health care provider if you should clean the wound with mild soap and water. Doing this may include: ? Using a clean towel to pat the wound dry after cleaning it. Do not rub or scrub the wound. ? Applying a cream or ointment. Do this only as told by your health care provider. ? Covering the incision with a clean dressing.  Ask your health care provider when you can leave the wound uncovered.  Keep the dressing dry until your health care provider says it can be removed. Do not take baths, swim, use a hot tub, or do anything that would put the wound underwater until your health care provider approves. Ask your health care provider if you can take showers. You may only be allowed to take sponge baths. Medicines   If you were prescribed an antibiotic medicine, cream, or ointment, take or use the antibiotic as told by your health care provider. Do not stop taking or using the antibiotic even if your condition improves.  Take over-the-counter and prescription medicines only as told by your health care provider. If you were prescribed pain medicine, take it 30 or more minutes before you do any wound care or as told by your health care provider. General instructions  Return to your normal activities as told by your health care provider. Ask your health care  provider what activities are safe.  Do not scratch or pick at the wound.  Do not use any products that contain nicotine or tobacco, such as cigarettes and e-cigarettes. These may delay wound healing. If you need help quitting, ask your health care provider.  Keep all follow-up visits as told by your health care provider. This is important.  Eat a diet that includes protein, vitamin A, vitamin C, and other nutrient-rich foods to help the wound heal. ? Foods rich in protein include meat, dairy, beans, nuts, and  other sources. ? Foods rich in vitamin A include carrots and dark green, leafy vegetables. ? Foods rich in vitamin C include citrus, tomatoes, and other fruits and vegetables. ? Nutrient-rich foods have protein, carbohydrates, fat, vitamins, or minerals. Eat a variety of healthy foods including vegetables, fruits, and whole grains. Contact a health care provider if:  You received a tetanus shot and you have swelling, severe pain, redness, or bleeding at the injection site.  Your pain is not controlled with medicine.  You have redness, swelling, or pain around the wound.  You have fluid or blood coming from the wound.  Your wound feels warm to the touch.  You have pus or a bad smell coming from the wound.  You have a fever or chills.  You are nauseous or you vomit.  You are dizzy. Get help right away if:  You have a red streak going away from your wound.  The edges of the wound open up and separate.  Your wound is bleeding, and the bleeding does not stop with gentle pressure.  You have a rash.  You faint.  You have trouble breathing. Summary  Always wash your hands with soap and water before changing your bandage (dressing).  To help with healing, eat foods that are rich in protein, vitamin A, vitamin C, and other nutrients.  Check your wound every day for signs of infection. Contact your health care provider if you suspect that your wound is infected. This information is not intended to replace advice given to you by your health care provider. Make sure you discuss any questions you have with your health care provider. Document Released: 04/29/2008 Document Revised: 11/08/2018 Document Reviewed: 02/05/2016 Elsevier Patient Education  2020 [employer].

## 2019-06-23 NOTE — Anesthesia Postprocedure Evaluation (Signed)
Anesthesia Post Note  Patient: Jonathan Bartlett  Procedure(s) Performed: OPEN REDUCTION INTERNAL FIXATION (ORIF) ANKLE FRACTURE (Left Ankle)  Patient location during evaluation: PACU Anesthesia Type: General and Regional Level of consciousness: awake and alert, oriented and patient cooperative Pain management: pain level controlled Vital Signs Assessment: post-procedure vital signs reviewed and stable Respiratory status: spontaneous breathing and respiratory function stable Cardiovascular status: blood pressure returned to baseline and stable Postop Assessment: no headache, no backache, adequate PO intake and no apparent nausea or vomiting Anesthetic complications: no     Last Vitals:  Vitals:   06/23/19 1100 06/23/19 1354  Pulse: 80   Temp: 36.9 C (P) 37.7 C  SpO2:  (P) 100%    Last Pain:  Vitals:   06/23/19 1100  TempSrc: Oral  PainSc: 0-No pain                 Riniyah Speich

## 2019-06-23 NOTE — Anesthesia Procedure Notes (Signed)
Anesthesia Regional Block: Adductor canal block (saphenous block)   Pre-Anesthetic Checklist: ,, timeout performed, Correct Patient, Correct Site, Correct Laterality, Correct Procedure, Correct Position, site marked, Risks and benefits discussed,  Surgical consent,  Pre-op evaluation,  At surgeon's request and post-op pain management  Laterality: Left  Prep: chloraprep       Needles:  Injection technique: Single-shot  Needle Type: Echogenic Stimulator Needle     Needle Length: 10cm  Needle Gauge: 20   Needle insertion depth: 6 cm   Additional Needles:   Procedures:,,,, ultrasound used (permanent image in chart),,,,  Narrative:  Start time: 06/23/2019 12:20 PM End time: 06/23/2019 12:25 PM Injection made incrementally with aspirations every 18 mL.  Performed by: Personally  Anesthesiologist: Denese Killings, MD  Additional Notes: Bupivacaine 0.25% with epi 1:200000 with dexamethasone 4 mg - 18 ml

## 2019-06-23 NOTE — Op Note (Signed)
06/23/2019  1:43 PM  PATIENT:  Jonathan Bartlett  38 y.o. male  PRE-OPERATIVE DIAGNOSIS:  left ankle fracture bimolleolar  POST-OPERATIVE DIAGNOSIS:, Bimalleolar left ankle fracture  Stryker implants lateral plate and 6 screws medial K wires times two 1.6  Assisted by Debbie Dallas  General anesthesia with 2 blocks 1 posterior and 1 lateral  EBL minimal  Blood administered none  No drains  No extra injections were performed  PROCEDURE:  Procedure(s): OPEN REDUCTION INTERNAL FIXATION (ORIF) ANKLE FRACTURE (Left)  SURGEON:  Surgeon(s) and Role:    * Nichlas Pitera E, MD - Primary  No specimens  Count correct  Tourniquet time 48 minutes   DICTATION: .Dragon Dictation  PLAN OF CARE: Discharge to home after PACU  PATIENT DISPOSITION:  PACU - hemodynamically stable.   Delay start of Pharmacological VTE agent (>24hrs) due to surgical blood loss or risk of bleeding: not applicable  Surgery was done as follows  The patient was seen in preop the surgical site was confirmed and marked his left ankle  Patient was taken to surgery had a block placed by anesthesia 1 posterior and 1 medially.  This was done after general anesthesia  He was in the supine position with 2 sandbags under the left hip for internal rotation of the leg and 3 blankets placed under the operative extremity  After sterile prep and drape timeout was completed limb was then exsanguinated with 4 inch Esmarch tourniquet inflated to 300 mmHg  The incision was made laterally over the malleolus and taken down to bone creating full-thickness skin flaps.  The fracture was irrigated debrided manually reduced and held with a pointed reduction clamp after which radiographs were taken.  Ankle mortise was restored as was length of the fibula.  Fracture was also reduced anatomically.  We then contoured a one third tubular plate placed at laterally and placed 6 screws starting closest to the fracture site on each hand  and then progressing away from the fracture  Radiographs confirm the reduction the ankle mortise restoration and the overall length of the fibula  We then turned our attention medially we made a straight incision over the medial fracture which was very small the subcutaneous tissue was divided the fracture site was debrided the ankle joint was irrigated the fracture was manually reduced and held with a pointed reduction clamp after which radiograph confirmed its reduction  We placed 2, 1.6 mm K wires  We checked x-ray confirmed reduction of the medial malleolar fracture  We then backed the pin out and then bent it and drove it back in and did the same with the second pin  We took x-rays and AP lateral and oblique in the fracture sites were reduced and the hardware was in good position  Both wounds were irrigated copiously with saline the medial wound was closed with 0 Monocryl and staples the lateral wound was closed with 0 Monocryl and staples  The tourniquet was then released   Good color and capillary refill noted dressings were applied sugar tong splint was applied foot was in neutral position at the ankle joint  Extubation and returned to recovery room in stable condition  3 weeks of no weightbearing in a cast and then convert to ankle CAM Walker  At 6 weeks he can weight-bear as tolerated Total time in the cam walker 12 weeks  
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
## 2019-06-23 NOTE — Anesthesia Procedure Notes (Signed)
Anesthesia Regional Block: Popliteal block   Pre-Anesthetic Checklist: ,, timeout performed, Correct Patient, Correct Site, Correct Laterality, Correct Procedure, Correct Position, site marked, Risks and benefits discussed,  Surgical consent,  Pre-op evaluation,  At surgeon's request and post-op pain management  Laterality: Left  Prep: chloraprep       Needles:   Needle Type: Echogenic Stimulator Needle     Needle Length: 10cm  Needle Gauge: 20   Needle insertion depth: 6 cm   Additional Needles:   Procedures:,,,, ultrasound used (permanent image in chart),,,,  Narrative:  Start time: 06/23/2019 12:10 PM End time: 06/23/2019 12:18 PM Injection made incrementally with aspirations every 30 mL.  Performed by: Personally  Anesthesiologist: Denese Killings, MD  Additional Notes: Bupivacaine 0.5% with dexamethasone 4 mg - 30 ml, local infiltration with lidocaine 1% 3 ml

## 2019-06-24 ENCOUNTER — Encounter (HOSPITAL_COMMUNITY): Payer: Self-pay | Admitting: Orthopedic Surgery

## 2019-06-28 ENCOUNTER — Ambulatory Visit (INDEPENDENT_AMBULATORY_CARE_PROVIDER_SITE_OTHER): Payer: Medicaid Other | Admitting: Orthopedic Surgery

## 2019-06-28 ENCOUNTER — Encounter: Payer: Self-pay | Admitting: Orthopedic Surgery

## 2019-06-28 ENCOUNTER — Other Ambulatory Visit: Payer: Self-pay

## 2019-06-28 DIAGNOSIS — S82842D Displaced bimalleolar fracture of left lower leg, subsequent encounter for closed fracture with routine healing: Secondary | ICD-10-CM

## 2019-06-28 DIAGNOSIS — Z9889 Other specified postprocedural states: Secondary | ICD-10-CM

## 2019-06-28 DIAGNOSIS — Z8781 Personal history of (healed) traumatic fracture: Secondary | ICD-10-CM

## 2019-06-28 NOTE — Progress Notes (Signed)
POST OP VISIT   S/P ORIF LEFT ANKLE   Surgery date November 19 this is postop day 5  Splint change wound checked  Wound is clean no drainage  New splint applied foot in neutral position  Recommend x-ray on December 4, remove staples, placed in cam walker on that date.

## 2019-07-05 DIAGNOSIS — Z8781 Personal history of (healed) traumatic fracture: Secondary | ICD-10-CM | POA: Insufficient documentation

## 2019-07-05 DIAGNOSIS — Z9889 Other specified postprocedural states: Secondary | ICD-10-CM | POA: Insufficient documentation

## 2019-07-08 ENCOUNTER — Ambulatory Visit (INDEPENDENT_AMBULATORY_CARE_PROVIDER_SITE_OTHER): Payer: Medicaid Other | Admitting: Orthopedic Surgery

## 2019-07-08 ENCOUNTER — Ambulatory Visit (INDEPENDENT_AMBULATORY_CARE_PROVIDER_SITE_OTHER): Payer: Medicaid Other

## 2019-07-08 ENCOUNTER — Other Ambulatory Visit: Payer: Self-pay

## 2019-07-08 DIAGNOSIS — S82842D Displaced bimalleolar fracture of left lower leg, subsequent encounter for closed fracture with routine healing: Secondary | ICD-10-CM

## 2019-07-08 DIAGNOSIS — Z8781 Personal history of (healed) traumatic fracture: Secondary | ICD-10-CM

## 2019-07-08 DIAGNOSIS — Z9889 Other specified postprocedural states: Secondary | ICD-10-CM | POA: Diagnosis not present

## 2019-07-08 NOTE — Progress Notes (Signed)
Chief Complaint  Patient presents with  . Routine Post Op    ankle left ORIF 06/23/2019    Ankle fracture 2 weeks ago x-rays out of plaster x-rays look good wounds look good patient placed in cam walker weight-bear as tolerated follow-up 4 weeks repeat x-ray

## 2019-08-08 ENCOUNTER — Ambulatory Visit: Payer: Medicaid Other

## 2019-08-08 ENCOUNTER — Other Ambulatory Visit: Payer: Self-pay

## 2019-08-08 ENCOUNTER — Ambulatory Visit (INDEPENDENT_AMBULATORY_CARE_PROVIDER_SITE_OTHER): Payer: Medicaid Other | Admitting: Orthopedic Surgery

## 2019-08-08 VITALS — BP 100/68 | HR 75 | Temp 97.9°F | Ht 70.0 in | Wt 170.0 lb

## 2019-08-08 DIAGNOSIS — S82842D Displaced bimalleolar fracture of left lower leg, subsequent encounter for closed fracture with routine healing: Secondary | ICD-10-CM

## 2019-08-08 DIAGNOSIS — Z9889 Other specified postprocedural states: Secondary | ICD-10-CM

## 2019-08-08 DIAGNOSIS — Z8781 Personal history of (healed) traumatic fracture: Secondary | ICD-10-CM

## 2019-08-08 NOTE — Patient Instructions (Signed)
Boot on when walking

## 2019-08-08 NOTE — Progress Notes (Signed)
POST OP VISIT   Patient ID: Jonathan Bartlett, male   DOB: 1980/08/14, 39 y.o.   MRN: [id number]  Chief Complaint  Patient presents with  . Follow-up    Recheck on left ankle, DOS 06-23-19.    Encounter Diagnoses  Name Primary?  . S/P ORIF (open reduction internal fixation) fracture left ankle 06/23/19 Yes  . Closed bimalleolar fracture of left ankle with routine healing, subsequent encounter   Wounds look good ankle motion is good  Implants  Stryker ankle solutions lateral multihole plate 2 medial K wires  WB as tolerated bone  Incision: Look great  Work: Not applicable  FU 6 weeks for x-ray

## 2019-09-19 ENCOUNTER — Ambulatory Visit: Payer: Medicaid Other | Admitting: Orthopedic Surgery

## 2019-09-23 ENCOUNTER — Ambulatory Visit: Payer: Medicaid Other

## 2019-09-23 ENCOUNTER — Other Ambulatory Visit: Payer: Self-pay

## 2019-09-23 ENCOUNTER — Ambulatory Visit (INDEPENDENT_AMBULATORY_CARE_PROVIDER_SITE_OTHER): Payer: Medicaid Other | Admitting: Orthopedic Surgery

## 2019-09-23 DIAGNOSIS — S82842D Displaced bimalleolar fracture of left lower leg, subsequent encounter for closed fracture with routine healing: Secondary | ICD-10-CM

## 2019-09-23 DIAGNOSIS — Z4889 Encounter for other specified surgical aftercare: Secondary | ICD-10-CM

## 2019-09-23 NOTE — Progress Notes (Signed)
Chief Complaint  Patient presents with  . Ankle Pain    L/ feeling much better/DOS11/19/20/ORIF    Status post ORIF November 19 ankle fracture doing well currently in a postop [facility] Walker x-rays show fracture healing patient's range of motion and wounds look good  Patient released  Encounter Diagnoses  Name Primary?  . Closed bimalleolar fracture of left ankle with routine healing, subsequent encounter   . Aftercare following surgery Yes

## 2020-09-12 ENCOUNTER — Other Ambulatory Visit: Payer: Self-pay | Admitting: Orthopedic Surgery

## 2020-09-12 MED ORDER — IBUPROFEN 800 MG PO TABS: 800.0000 mg | ORAL_TABLET | Freq: Three times a day (TID) | ORAL | 0 refills | Status: DC | PRN

## 2020-09-12 NOTE — Telephone Encounter (Signed)
Patient requests refill on Ibuprofen 800 mgs. Qty 90   Sig: Take 1 tablet (800 mg total) by mouth every 8 (eight) hours as needed.  Patient uses [facility] in [place]
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
## 2022-10-02 ENCOUNTER — Encounter: Payer: Self-pay | Admitting: Radiology

## 2023-03-24 ENCOUNTER — Ambulatory Visit: Payer: MEDICAID | Admitting: Podiatry

## 2023-03-31 ENCOUNTER — Encounter: Payer: Self-pay | Admitting: Podiatry

## 2023-03-31 ENCOUNTER — Ambulatory Visit (INDEPENDENT_AMBULATORY_CARE_PROVIDER_SITE_OTHER): Payer: MEDICAID | Admitting: Podiatry

## 2023-03-31 DIAGNOSIS — M79674 Pain in right toe(s): Secondary | ICD-10-CM

## 2023-03-31 DIAGNOSIS — B351 Tinea unguium: Secondary | ICD-10-CM

## 2023-03-31 DIAGNOSIS — M79675 Pain in left toe(s): Secondary | ICD-10-CM

## 2023-03-31 MED ORDER — TERBINAFINE HCL 250 MG PO TABS: 250.0000 mg | ORAL_TABLET | Freq: Every day | ORAL | 0 refills | Status: DC

## 2023-03-31 NOTE — Progress Notes (Signed)
   Chief Complaint  Patient presents with   Nail Problem    "My toenails" N - toenails L - hallux bilateral D - 2 years O - gradually worse C - looks like they're about to come off, yellow A - shoes T - none    Subjective: 42 y.o. male presenting today as a new patient for evaluation of pain and tenderness associated to the toenails bilateral.  He says they are very symptomatic and painful in tennis shoes.  He has not anything for treatment.  Presenting for further treatment and evaluation  Past Medical History:  Diagnosis Date   Anxiety    Autism    OCD (obsessive compulsive disorder)     Past Surgical History:  Procedure Laterality Date   MANDIBLE FRACTURE SURGERY     ORIF ANKLE FRACTURE Left 06/23/2019   Procedure: OPEN REDUCTION INTERNAL FIXATION (ORIF) ANKLE FRACTURE;  Surgeon: Vickki Hearing, MD;  Location: [facility];  Service: Orthopedics;  Laterality: Left;    Allergies  Allergen Reactions   Bee Venom Anaphylaxis    Objective: Physical Exam General: The patient is alert and oriented x3 in no acute distress.  Dermatology: Hyperkeratotic, discolored, thickened, onychodystrophy noted. Skin is warm, dry and supple bilateral lower extremities. Negative for open lesions or macerations.  Vascular: Palpable pedal pulses bilaterally. No edema or erythema noted. Capillary refill within normal limits.  Neurological: Grossly intact via light touch  Musculoskeletal Exam: No pedal deformity noted  Assessment: #1 Onychomycosis of toenails bilateral  Plan of Care:  -Patient was evaluated. -Today we discussed different treatment options including oral, topical, and laser antifungal treatment modalities.  We discussed their efficacies and side effects.  Patient opts for oral antifungal treatment modality -Prescription for Lamisil 250 mg #90 daily. Pt denies a history of liver pathology or symptoms.  Patient is otherwise healthy -Mechanical debridement of nails 1-5  bilateral was performed today using enalapril tolerance or bleeding -Return to clinic 6 months   Felecia Shelling, DPM [facility]  Dr. Felecia Shelling, DPM    [address] [place], [place] [address]                Office [phone]  Fax [phone]

## 2023-06-01 ENCOUNTER — Other Ambulatory Visit: Payer: Self-pay | Admitting: Podiatry

## 2023-06-02 ENCOUNTER — Telehealth: Payer: Self-pay | Admitting: Podiatry

## 2023-06-02 ENCOUNTER — Other Ambulatory Visit: Payer: Self-pay | Admitting: Podiatry

## 2023-06-02 DIAGNOSIS — Z79899 Other long term (current) drug therapy: Secondary | ICD-10-CM

## 2023-06-02 NOTE — Telephone Encounter (Signed)
Received call from [facility] at pts nursing facility and they use [facility] and there are no refills on the lamisil and they are needing you to dc the medication so it will come off the pts MAR. If any questions please call Doneesha at [phone]

## 2023-06-02 NOTE — Telephone Encounter (Signed)
Prior to refill patient needs an updated hepatic function panel. I just placed the order.  He can come to the front desk and pick up the order and go over to [facility] to have it completed. If it's normal I'll refill the lamisil. Could you please call this patient and notify him??? Thanks! -Dr. Logan Bores

## 2023-06-04 ENCOUNTER — Other Ambulatory Visit: Payer: Self-pay | Admitting: Podiatry

## 2023-06-04 NOTE — Telephone Encounter (Signed)
Done. Thanks, Dr. Amalia Hailey

## 2023-06-05 NOTE — Telephone Encounter (Signed)
Left message for pts nursing facility that the Lamisil medication has been dc'ed from pts chart.

## 2023-08-22 ENCOUNTER — Emergency Department
Admission: EM | Admit: 2023-08-22 | Discharge: 2023-08-25 | Disposition: A | Discharge status: Other Acute Inpatient Hospital | Payer: MEDICAID | Attending: Emergency Medicine | Admitting: Emergency Medicine

## 2023-08-22 ENCOUNTER — Other Ambulatory Visit: Payer: Self-pay

## 2023-08-22 DIAGNOSIS — F32A Depression, unspecified: Secondary | ICD-10-CM

## 2023-08-22 DIAGNOSIS — Z1152 Encounter for screening for COVID-19: Secondary | ICD-10-CM | POA: Insufficient documentation

## 2023-08-22 DIAGNOSIS — F331 Major depressive disorder, recurrent, moderate: Secondary | ICD-10-CM | POA: Insufficient documentation

## 2023-08-22 DIAGNOSIS — F341 Dysthymic disorder: Secondary | ICD-10-CM

## 2023-08-22 DIAGNOSIS — R45851 Suicidal ideations: Secondary | ICD-10-CM | POA: Insufficient documentation

## 2023-08-22 DIAGNOSIS — F411 Generalized anxiety disorder: Secondary | ICD-10-CM | POA: Diagnosis not present

## 2023-08-22 LAB — ACETAMINOPHEN LEVEL: Acetaminophen (Tylenol), Serum: <10 ug/mL — ABNORMAL LOW (ref 10–30)

## 2023-08-22 LAB — COMPREHENSIVE METABOLIC PANEL
ALT: 16 U/L (ref 0–44)
AST: 24 U/L (ref 15–41)
Albumin: 4.5 g/dL (ref 3.5–5.0)
Alkaline Phosphatase: 54 U/L (ref 38–126)
Anion gap: 13 (ref 5–15)
BUN: 17 mg/dL (ref 6–20)
CO2: 23 mmol/L (ref 22–32)
Calcium: 9.3 mg/dL (ref 8.9–10.3)
Chloride: 102 mmol/L (ref 98–111)
Creatinine, Ser: 1.09 mg/dL (ref 0.61–1.24)
GFR, Estimated: >60 mL/min (ref >60)
Glucose, Bld: 82 mg/dL (ref 70–99)
Potassium: 3.8 mmol/L (ref 3.5–5.1)
Sodium: 138 mmol/L (ref 135–145)
Total Bilirubin: 1.2 mg/dL (ref 0.0–1.2)
Total Protein: 7.6 g/dL (ref 6.5–8.1)

## 2023-08-22 LAB — CBC
HCT: 44.6 % (ref 39.0–52.0)
Hemoglobin: 16.1 g/dL (ref 13.0–17.0)
MCH: 32.5 pg (ref 26.0–34.0)
MCHC: 36.1 g/dL — ABNORMAL HIGH (ref 30.0–36.0)
MCV: 90.1 fL (ref 80.0–100.0)
Platelets: 191 10*3/uL (ref 150–400)
RBC: 4.95 MIL/uL (ref 4.22–5.81)
RDW: 12.3 % (ref 11.5–15.5)
WBC: 6.5 10*3/uL (ref 4.0–10.5)
nRBC: 0 % (ref 0.0–0.2)

## 2023-08-22 LAB — ETHANOL: Alcohol, Ethyl (B): <10 mg/dL (ref <10)

## 2023-08-22 LAB — SALICYLATE LEVEL: Salicylate Lvl: <7 mg/dL — ABNORMAL LOW (ref 7.0–30.0)
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
## 2023-08-22 NOTE — ED Notes (Signed)
Pt dressed out: [employer] colored pants Black coat Black tshirt Glasses - can keep for vision Black socks Blue underwear

## 2023-08-22 NOTE — ED Provider Notes (Signed)
[facility] Provider Note    Event Date/Time   First MD Initiated Contact with Patient 08/22/23 2020     (approximate)   History   Psychiatric Evaluation   HPI  Jonathan Bartlett is a 43 y.o. male who presents to the ED for evaluation of Psychiatric Evaluation   Patient presents to the ED from his group home under IVC with law enforcement due to a formulated plan to harm himself.  He reports frustration with communicating with girls.  Reports 1 shunned him and so he wanted to kill himself.  Reports a continued desire to harm himself with a plan of cutting himself with a knife.   Physical Exam   Triage Vital Signs: ED Triage Vitals  Encounter Vitals Group     BP 08/22/23 2007 118/83     Systolic BP Percentile --      Diastolic BP Percentile --      Pulse Rate 08/22/23 2007 81     Resp 08/22/23 2007 17     Temp 08/22/23 2007 98.8 F (37.1 C)     Temp Source 08/22/23 2007 Oral     SpO2 08/22/23 2007 95 %     Weight 08/22/23 2002 190 lb (86.2 kg)     Height 08/22/23 2002 5\' 10"  (1.778 m)     Head Circumference --      Peak Flow --      Pain Score 08/22/23 2002 0     Pain Loc --      Pain Education --      Exclude from Growth Chart --     Most recent vital signs: Vitals:   08/22/23 2007  BP: 118/83  Pulse: 81  Resp: 17  Temp: 98.8 F (37.1 C)  SpO2: 95%    General: Awake, no distress.  CV:  Good peripheral perfusion.  Resp:  Normal effort.  Abd:  No distention.  MSK:  No deformity noted.  Neuro:  No focal deficits appreciated. Other:     ED Results / Procedures / Treatments   Labs (all labs ordered are listed, but only abnormal results are displayed) Labs Reviewed  SALICYLATE LEVEL - Abnormal; Notable for the following components:      Result Value   Salicylate Lvl <7.0 (*)    All other components within normal limits  ACETAMINOPHEN LEVEL - Abnormal; Notable for the following components:   Acetaminophen (Tylenol), Serum <10 (*)     All other components within normal limits  CBC - Abnormal; Notable for the following components:   MCHC 36.1 (*)    All other components within normal limits  ETHANOL  COMPREHENSIVE METABOLIC PANEL  URINE DRUG SCREEN, QUALITATIVE ([facility] ONLY)    EKG   RADIOLOGY   Official radiology report(s): No results found.  PROCEDURES and INTERVENTIONS:  Procedures  Medications - No data to display   IMPRESSION / MDM / ASSESSMENT AND PLAN / ED COURSE  I reviewed the triage vital signs and the nursing notes.  Differential diagnosis includes, but is not limited to, overdose or substance induced mood disorder, withdrawals, acutely suicidal  {Patient presents with symptoms of an acute illness or injury that is potentially life-threatening.  Pleasant patient on the autism spectrum presents with a formulated plan to harm himself.  We will uphold IVC and have psychiatry evaluate him.  He has normal vital signs without signs of trauma or self cutting.  Normal CBC.  No evidence of medical pathology to preclude psychiatric evaluation  or disposition.      FINAL CLINICAL IMPRESSION(S) / ED DIAGNOSES   Final diagnoses:  Suicidal ideation     Rx / DC Orders   ED Discharge Orders     None        Note:  This document was prepared using Dragon voice recognition software and may include unintentional dictation errors.   Delton Prairie, MD 08/22/23 2046
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
## 2023-08-23 LAB — URINE DRUG SCREEN, QUALITATIVE (ARMC ONLY)
Amphetamines, Ur Screen: NOT DETECTED
Barbiturates, Ur Screen: NOT DETECTED
Benzodiazepine, Ur Scrn: NOT DETECTED
Cannabinoid 50 Ng, Ur ~~LOC~~: NOT DETECTED
Cocaine Metabolite,Ur ~~LOC~~: NOT DETECTED
MDMA (Ecstasy)Ur Screen: NOT DETECTED
Methadone Scn, Ur: NOT DETECTED
Opiate, Ur Screen: NOT DETECTED
Phencyclidine (PCP) Ur S: NOT DETECTED
Tricyclic, Ur Screen: NOT DETECTED

## 2023-08-23 NOTE — ED Notes (Signed)
Pt asked me to step into his room to speak about why he was here. He states "I tried cutting myself because I was sad and feel like nobody cares about me." I asked why he felt sad and he said "I met a girl in [place] at [employer], named Zoe, and wanted her to call me so I left my number. I called and told her manager to tell her to call me after work but she never did. Then I called again and Zoe answered and said to never call again. That is what made me want to start cutting myself." Pt expressed "my medication doctor, Dr.Chandler, prescribes me anxiety medication and I feel that it doesn't work sometimes." I told his nurse, Art therapist. Pt was calm verbalizing his emotions and feelings. This tech tried to encourage pt to take it one day at a time and to look forward not backwards.

## 2023-08-23 NOTE — BH Assessment (Signed)
Comprehensive Clinical Assessment (CCA) Note  08/23/2023 Jonathan Bartlett [id number]  Chief Complaint: Patient is a 43 year old male presenting to [facility] ED initially voluntarily but has since been IVC'd. Per triage note Pt to ed from group home via [facility] for IVC for suicidal intentions. PD advised he placed a knife to his arm threatening to end his life but has no lacerations to his arm. Pt is caox4, in no acute distress and ambulatory in triage. Pt admits to suicidal ideation. During assessment patient appears alert and oriented x4, calm and cooperative. Patient reports "I was about to cut myself, things aren't going right at my group home, my foot got broken 3 or 4 years ago and I have a medal in my left leg, I'm having doubts about life." Patient reports having suicidal thoughts "for a while." Patient currently has a psychiatric outpatient provider in [facility] "Dr. Debbora Presto" and takes his medications as prescribed. Patient continues to report SI, denies HI/AH/VH.  Per Psyc NP Lerry Liner patient is recommended for Inpatient Chief Complaint  Patient presents with   Psychiatric Evaluation   Visit Diagnosis: Depression   CCA Screening, Triage and Referral (STR)  Patient Reported Information How did you hear about [place]? Other (Comment)  Referral name: No data recorded Referral phone number: No data recorded  Whom do you see for routine medical problems? No data recorded Practice/Facility Name: No data recorded Practice/Facility Phone Number: No data recorded Name of Contact: No data recorded Contact Number: No data recorded Contact Fax Number: No data recorded Prescriber Name: No data recorded Prescriber Address (if known): No data recorded  What Is the Reason for Your Visit/Call Today? Pt to ed from group home via [facility] for IVC for suicidal intentions. PD advised he placed a knife to his arm threatening to end his life but has no lacerations to his arm. Pt is caox4, in no acute distress and  ambulatory in triage. Pt admits to suicidal ideation.  How Long Has This Been Causing You Problems? > than 6 months  What Do You Feel Would Help You the Most Today? Treatment for Depression or other mood problem   Have You Recently Been in Any Inpatient Treatment (Hospital/Detox/Crisis Center/28-Day Program)? No data recorded Name/Location of Program/Hospital:No data recorded How Long Were You There? No data recorded When Were You Discharged? No data recorded  Have You Ever Received Services From [facility] Before? No data recorded Who Do You See at [facility]? No data recorded  Have You Recently Had Any Thoughts About Hurting Yourself? Yes  Are You Planning to Commit Suicide/Harm Yourself At This time? No   Have you Recently Had Thoughts About Hurting Someone Karolee Ohs? No  Explanation: No data recorded  Have You Used Any Alcohol or Drugs in the Past 24 Hours? No  How Long Ago Did You Use Drugs or Alcohol? No data recorded What Did You Use and How Much? No data recorded  Do You Currently Have a Therapist/Psychiatrist? Yes  Name of Therapist/Psychiatrist: Dr. Debbora Presto   Have You Been Recently Discharged From Any Office Practice or Programs? No  Explanation of Discharge From Practice/Program: No data recorded    CCA Screening Triage Referral Assessment Type of Contact: Face-to-Face  Is this Initial or Reassessment? No data recorded Date Telepsych consult ordered in [facility]:  No data recorded Time Telepsych consult ordered in [facility]:  No data recorded  Patient Reported Information Reviewed? No data recorded Patient Left Without Being Seen? No data recorded Reason for  Not Completing Assessment: No data recorded  Collateral Involvement: No data recorded  Does Patient Have a Court Appointed Legal Guardian? No data recorded Name and Contact of Legal Guardian: No data recorded If Minor and Not Living with Parent(s), Who has Custody? No data recorded Is CPS involved or ever been  involved? Never  Is APS involved or ever been involved? Never   Patient Determined To Be At Risk for Harm To Self or Others Based on Review of Patient Reported Information or Presenting Complaint? No  Method: No data recorded Availability of Means: No data recorded Intent: No data recorded Notification Required: No data recorded Additional Information for Danger to Others Potential: No data recorded Additional Comments for Danger to Others Potential: No data recorded Are There Guns or Other Weapons in Your Home? No  Types of Guns/Weapons: No data recorded Are These Weapons Safely Secured?                            No data recorded Who Could Verify You Are Able To Have These Secured: No data recorded Do You Have any Outstanding Charges, Pending Court Dates, Parole/Probation? No data recorded Contacted To Inform of Risk of Harm To Self or Others: No data recorded  Location of Assessment: [facility] ED   Does Patient Present under Involuntary Commitment? Yes  IVC Papers Initial File Date: No data recorded  [place] of Residence: [place]   Patient Currently Receiving the Following Services: Medication Management; Group Home   Determination of Need: Emergent (2 hours)   Options For Referral: No data recorded    CCA Biopsychosocial Intake/Chief Complaint:  No data recorded Current Symptoms/Problems: No data recorded  Patient Reported Schizophrenia/Schizoaffective Diagnosis in Past: No   Strengths: Patient is able to communicate his needs; has stable housing; supportive family  Preferences: No data recorded Abilities: No data recorded  Type of Services Patient Feels are Needed: No data recorded  Initial Clinical Notes/Concerns: No data recorded  Mental Health Symptoms Depression:  Change in energy/activity; Fatigue; Hopelessness   Duration of Depressive symptoms: Greater than two weeks   Mania:  None   Anxiety:   Worrying   Psychosis:  None   Duration of  Psychotic symptoms: No data recorded  Trauma:  None   Obsessions:  None   Compulsions:  None   Inattention:  None   Hyperactivity/Impulsivity:  None   Oppositional/Defiant Behaviors:  None   Emotional Irregularity:  None   Other Mood/Personality Symptoms:  No data recorded   Mental Status Exam Appearance and self-care  Stature:  Average   Weight:  Average weight   Clothing:  Casual   Grooming:  Normal   Cosmetic use:  None   Posture/gait:  Normal   Motor activity:  Not Remarkable   Sensorium  Attention:  Normal   Concentration:  Normal   Orientation:  X5   Recall/memory:  Normal   Affect and Mood  Affect:  Appropriate   Mood:  Other (Comment)   Relating  Eye contact:  Normal   Facial expression:  Responsive   Attitude toward examiner:  Cooperative   Thought and Language  Speech flow: Clear and Coherent   Thought content:  Appropriate to Mood and Circumstances   Preoccupation:  None   Hallucinations:  None   Organization:  No data recorded  [employer] of Knowledge:  Fair   Intelligence:  Needs investigation   Abstraction:  Functional  Judgement:  Fair   Dance movement psychotherapist:  Adequate   Insight:  Fair   Decision Making:  Normal   Social Functioning  Social Maturity:  Responsible   Social Judgement:  Normal   Stress  Stressors:  Housing   Coping Ability:  Normal   Skill Deficits:  Intellect/education   Supports:  Family; Friends/Service system     Religion: Religion/Spirituality Are You A Religious Person?: No  Leisure/Recreation: Leisure / Recreation Do You Have Hobbies?: No  Exercise/Diet: Exercise/Diet Do You Exercise?: No Have You Gained or Lost A Significant Amount of Weight in the Past Six Months?: No Do You Follow a Special Diet?: No Do You Have Any Trouble Sleeping?: No   CCA Employment/Education Employment/Work Situation: Employment / Work Systems developer: On  disability Why is Patient on Disability: Mental Health How Long has Patient Been on Disability: Unknown Patient's Job has Been Impacted by Current Illness: No Has Patient ever Been in the [employer]?: No  Education: Education Is Patient Currently Attending School?: No Did You Have An Individualized Education Program (IIEP): No Did You Have Any Difficulty At School?: No Patient's Education Has Been Impacted by Current Illness: No   CCA Family/Childhood History Family and Relationship History: Family history Marital status: Single Does patient have children?: No  Childhood History:  Childhood History By whom was/is the patient raised?: Mother, Other (Comment) Did patient suffer any verbal/emotional/physical/sexual abuse as a child?: No Did patient suffer from severe childhood neglect?: No Has patient ever been sexually abused/assaulted/raped as an adolescent or adult?: No Was the patient ever a victim of a crime or a disaster?: No Witnessed domestic violence?: No Has patient been affected by domestic violence as an adult?: No  Child/Adolescent Assessment:     CCA Substance Use Alcohol/Drug Use: Alcohol / Drug Use Pain Medications: see mar Prescriptions: see mar Over the Counter: see mar History of alcohol / drug use?: No history of alcohol / drug abuse                         ASAM's:  Six Dimensions of Multidimensional Assessment  Dimension 1:  Acute Intoxication and/or Withdrawal Potential:      Dimension 2:  Biomedical Conditions and Complications:      Dimension 3:  Emotional, Behavioral, or Cognitive Conditions and Complications:     Dimension 4:  Readiness to Change:     Dimension 5:  Relapse, Continued use, or Continued Problem Potential:     Dimension 6:  Recovery/Living Environment:     ASAM Severity Score:    ASAM Recommended Level of Treatment:     Substance use Disorder (SUD)    Recommendations for Services/Supports/Treatments:    DSM5  Diagnoses: Patient Active Problem List   Diagnosis Date Noted   Depression 08/22/2023   Anxiety state 08/22/2023   S/P ORIF (open reduction internal fixation) fracture left ankle 06/23/19 07/05/2019   Closed left ankle fracture    Closed bimalleolar fracture of left ankle     Patient Centered Plan: Patient is on the following Treatment Plan(s):  Anxiety and Depression   Referrals to Alternative Service(s): Referred to Alternative Service(s):   Place:   Date:   Time:    Referred to Alternative Service(s):   Place:   Date:   Time:    Referred to Alternative Service(s):   Place:   Date:   Time:    Referred to Alternative Service(s):   Place:   Date:  Time:      @BHCOLLABOFCARE @  [employer], LCAS-A

## 2023-08-23 NOTE — BH Assessment (Signed)
Autism   Referral information for Child/Adolescent Placement have been faxed to;    [facility] [phone]),    [facility] [phone])

## 2023-08-23 NOTE — ED Notes (Signed)
 Dinner tray provided; Pt tolerated well; Waste disposed of properly.

## 2023-08-23 NOTE — ED Notes (Signed)
Pt received lunch tray and drink.  

## 2023-08-23 NOTE — ED Notes (Signed)
Breakfast provided.

## 2023-08-23 NOTE — ED Notes (Signed)
Shower and clean linens provided

## 2023-08-23 NOTE — ED Notes (Signed)
IVC, pend placement 

## 2023-08-23 NOTE — ED Provider Notes (Signed)
Emergency Medicine Observation Re-evaluation Note  Jonathan Bartlett is a 43 y.o. male, seen on rounds today.  Pt initially presented to the ED for complaints of Psychiatric Evaluation  Currently, the patient is is no acute distress. Denies any concerns at this time.  Physical Exam  Blood pressure 128/77, pulse 70, temperature 98.2 F (36.8 C), temperature source Oral, resp. rate 17, height 5\' 10"  (1.778 m), weight 86.2 kg, SpO2 96%.  Physical Exam: General: No apparent distress Psych: Resting comfortably     ED Course / MDM     I have reviewed the labs performed to date as well as medications administered while in observation.  Recent changes in the last 24 hours include: No acute events overnight.  Plan   Current plan: Patient awaiting placement.  Recommended inpatient placement by psychiatry. Patient is not under full IVC at this time.    Corena Herter, MD 08/23/23 2035

## 2023-08-23 NOTE — ED Notes (Signed)
Pt was given a pack of bath wipes to bathe himself

## 2023-08-23 NOTE — ED Notes (Signed)
IVC prior to arrival/Consult completed/ Rec. Inpt Admit

## 2023-08-24 DIAGNOSIS — F331 Major depressive disorder, recurrent, moderate: Secondary | ICD-10-CM

## 2023-08-24 LAB — SARS CORONAVIRUS 2 BY RT PCR: SARS Coronavirus 2 by RT PCR: NEGATIVE

## 2023-08-24 MED ORDER — HYDROXYZINE HCL 25 MG PO TABS
25.0000 mg | ORAL_TABLET | Freq: Three times a day (TID) | ORAL | Status: DC | PRN
Start: 2023-08-24 — End: 2023-08-25

## 2023-08-24 MED ORDER — DULOXETINE HCL 20 MG PO CPEP
20.0000 mg | ORAL_CAPSULE | Freq: Every day | ORAL | Status: DC
  Administered 2023-08-24 – 2023-08-25 (×2): 20 mg via ORAL
  Filled 2023-08-24 (×2): qty 1

## 2023-08-24 MED ORDER — LURASIDONE HCL 40 MG PO TABS
40.0000 mg | ORAL_TABLET | Freq: Every day | ORAL | Status: DC
  Administered 2023-08-25: 40 mg via ORAL
  Filled 2023-08-24: qty 1

## 2023-08-24 NOTE — Progress Notes (Signed)
Pt was accepted to [facility] TODAY 08/24/2023; Bed Assignment [facility] campus  Pt meets inpatient criteria per Josie Saunders  Attending Physician will be Dr. Mollie Germany  Report can be called to: [phone]  Pt can arrive after: BED IS READY  Care Team notified:Annie Dahlia Client [address] LCSWA 08/24/2023 @ 5:16 PM

## 2023-08-24 NOTE — ED Notes (Signed)
ivc/moved to bhu 3.

## 2023-08-24 NOTE — Progress Notes (Signed)
[facility] has accepted patient pending IVC & COVID TEST. Intake has informed CSW that accepting information will not be giving until they receive the following items above.   [place] Clint Biello LCSW-A   08/24/2023 3:17 PM

## 2023-08-24 NOTE — ED Notes (Signed)
Updated group home manager- reports she is calling father to update him as this RN was unable to contact him.

## 2023-08-24 NOTE — Consult Note (Addendum)
[facility] Psychiatric Consult Follow-up  Patient Name: .Jonathan Bartlett  MRN: [id number]  DOB: 01/03/1981  Consult Order details:  Orders (From admission, onward)     Start     Ordered   08/22/23 2048  CONSULT TO CALL ACT TEAM       Ordering Provider: Delton Prairie, MD  Provider:  (Not yet assigned)  Question:  Reason for Consult?  Answer:  Psych consult   08/22/23 2047   08/22/23 2048  IP CONSULT TO PSYCHIATRY       Ordering Provider: Delton Prairie, MD  Provider:  (Not yet assigned)  Question Answer Comment  Place call to: psych   Reason for Consult Admit      08/22/23 2047             Mode of Visit: In person, I spent with this consult.     Psychiatry Consult Evaluation  Service Date: August 24, 2023 LOS:  LOS: 0 days  Chief Complaint "The girl never called me back".   Primary Psychiatric Diagnoses  Major Depressive Disorder, recurrent, moderate 2.  Anxiety State  Assessment  Jonathan Bartlett is a 43 y.o. male admitted: Presented to the ED on 08/22/2023  8:17 PM for SI. He carries the psychiatric diagnoses of anxiety and depression.     His current presentation of SI is most consistent with depression. He meets criteria for psychiatric inpatient based on current symptoms.  Current outpatient psychotropic medications include Abilify as reported by the patient. At this time he is unsure of the dose.  Historically he has had a poor response to these medications. He was  compliant with medications prior to admission.  On initial examination, patient is calm with flat affect and depressed mood. Please see plan below for detailed recommendations.    Diagnoses:  Active Hospital problems: Principal Problem:   Depression Active Problems:   Anxiety state     Plan    ## Psychiatric Medication Recommendations:  Restart home med once reconciled   ## Medical Decision Making Capacity: Not specifically addressed in this encounter   ## Further Work-up:  Nabor Dowding was  admitted to [facility] under the service of Delton Prairie, MD for Depression, crisis management, and stabilization. Routine labs ordered, which include   Lab Orders         Comprehensive metabolic panel         Ethanol         Salicylate level         Acetaminophen level         cbc         Urine Drug Screen, Qualitative    Medication Management: Medications started      Will maintain observation checks every 15 minutes for safety. Psychosocial education regarding relapse prevention and self-care; social and communication  Social work will consult with family for collateral information and discuss discharge and follow up plan.     ## Disposition:-- We recommend inpatient psychiatric hospitalization when medically cleared. Patient is under voluntary admission status at this time; please IVC if attempts to leave hospital.   ## Behavioral / Environmental: -Recommend using specific terminology regarding PNES, i.e. call the episodes "non-epileptic seizures" rather than "pseudoseizures" as the latter insinuates "fake" or "feigned" symptoms, when the events are a very real experience to the patient and are a physical, non-volitional, manifestation of fear, pain and anxiety. , To minimize splitting of staff, assign one staff person to communicate all information from the  team when feasible., or Utilize compassion and acknowledge the patient's experiences while setting clear and realistic expectations for care.                ## Safety and Observation Level:  - Based on my clinical evaluation, I estimate the patient to be at moderate risk of self harm in the current setting. - At this time, we recommend  routine. This decision is based on my review of the chart including patient's history and current presentation, interview of the patient, mental status examination, and consideration of suicide risk including evaluating suicidal ideation, plan, intent, suicidal or self-harm  behaviors, risk factors, and protective factors. This judgment is based on our ability to directly address suicide risk, implement suicide prevention strategies, and develop a safety plan while the patient is in the clinical setting. Please contact our team if there is a concern that risk level has changed.   CSSR Risk Category:C-SSRS RISK CATEGORY: Low Risk   Suicide Risk Assessment: Patient has following modifiable risk factors for suicide: active suicidal ideation and under treated depression , which we are addressing by examining current mediations and recommending inpatient hospitalization. Patient has following non-modifiable or demographic risk factors for suicide: male gender, history of suicide attempt, and history of self harm behavior Patient has the following protective factors against suicide: Access to outpatient mental health care, Supportive family, and Frustration tolerance   Thank you for this consult request. Recommendations have been communicated to the primary team.  We will recommend inpatient hospitalization at this time.   Juliann Pares, NP       History of Present Illness  Relevant Aspects of Hospital ED Course:  Admitted on 08/22/2023 for anxiety and depression, with suicidal ideation. They are currently laying in bed sitting up, slow to answer but participating in interview.  Patient is calm and cooperative.   Patient Report:  43 year old male presenting to emergency department at [facility] for suicidal ideation on 08/23/2023, stating that he was upset because a girl did not call them back from [facility].  He thought that he was going to have a friend from a conversation while he was at the [facility] and waiting for the friend to come back.  He waited several days without any response in which he became stressed, at the group home he held a knife to his arm threatening to cut himself committing suicide.  He endorses that he has done this in the past with a scar visible  on his forearm.  While the patient was in interview the patient was slow to respond.  He currently has outpatient psychiatry established in [place].  As of now suicide screening is negative for SI, HI, AVH, and SIB.  When asked about what the potential causes are for the suicidal ideation the patient believes that it is medications he is receiving that is causing him to feel suicidal.  Patient denies alcohol, tobacco, and drug use. for this reason it is recommended for the patient to stay inpatient admission, for medication monitoring.    Psych ROS:  Depression: Endorses Anxiety: Endorses Mania (lifetime and current): Denies Psychosis: (lifetime and current): Denies   Review of Systems  Constitutional: Negative.   HENT: Negative.    Eyes: Negative.   Respiratory: Negative.    Cardiovascular: Negative.   Gastrointestinal: Negative.   Genitourinary: Negative.   Musculoskeletal: Negative.   Skin: Negative.   Psychiatric/Behavioral:  Positive for depression. The patient is nervous/anxious.  Psychiatric and Social History  Psychiatric History:  Information collected from patient  Prev Dx/Sx: Previous suicide attempts, generalized anxiety disorder, depression Current Psych Provider: [facility] Meds (current): Cymbalta, Latuda, and hydroxyzine Previous Med Trials: Denies Therapy: Denies  Prior Psych Hospitalization: None  Prior Self Harm: Endorses Prior Violence: Denies  Social History:  Occupational Hx: Uenmployed Legal Hx: None identified Living Situation: Group home  Access to weapons/lethal means: Denies   Substance History Alcohol: Denies  Tobacco: Denies  Illicit drugs: Denies    Exam Findings   Vital Signs:  Temp:  [98.2 F (36.8 C)-98.4 F (36.9 C)] 98.4 F (36.9 C) (01/20 0916) Pulse Rate:  [70-99] 99 (01/20 0916) Resp:  [17-18] 18 (01/20 0916) BP: (121-128)/(69-77) 121/69 (01/20 0916) SpO2:  [95 %-96 %] 95 % (01/20 0916) Blood pressure  121/69, pulse 99, temperature 98.4 F (36.9 C), temperature source Oral, resp. rate 18, height 5\' 10"  (1.778 m), weight 86.2 kg, SpO2 95%. Body mass index is 27.26 kg/m.  Physical Exam HENT:     Head: Normocephalic.     Mouth/Throat:     Mouth: Mucous membranes are dry.  Cardiovascular:     Rate and Rhythm: Normal rate.  Pulmonary:     Effort: Pulmonary effort is normal.  Skin:    General: Skin is warm and dry.  Neurological:     Mental Status: He is alert.  Psychiatric:        Mood and Affect: Mood is anxious.     Mental Status Exam: General Appearance: Casual  Orientation:  Full (Time, Place, and Person)  Memory:  Immediate;   Fair Recent;   Fair Remote;   Fair  Concentration:  Concentration: Fair and Attention Span: Fair  Recall:  Good  Attention  Fair  Eye Contact:  Good  Speech:  Slow  Language:  Good  Volume:  Decreased  Mood: Depressed and anxious  Affect:  Appropriate  Thought Process:  Coherent  Thought Content:  Illogical  Suicidal Thoughts:  No  Homicidal Thoughts:  No  Judgement:  Poor  Insight:  Lacking  Psychomotor Activity:  Normal  Akathisia:  No  Fund of Knowledge:  Good      Assets:  Housing Social Support  Cognition:  WNL  ADL's:  Intact  AIMS (if indicated):        Other History   These have been pulled in through the EMR, reviewed, and updated if appropriate.  Family History:  The patient's Family history is unknown by patient.  Medical History: Past Medical History:  Diagnosis Date   Anxiety    Autism    OCD (obsessive compulsive disorder)     Surgical History: Past Surgical History:  Procedure Laterality Date   MANDIBLE FRACTURE SURGERY     ORIF ANKLE FRACTURE Left 06/23/2019   Procedure: OPEN REDUCTION INTERNAL FIXATION (ORIF) ANKLE FRACTURE;  Surgeon: Vickki Hearing, MD;  Location: [facility];  Service: Orthopedics;  Laterality: Left;     Medications:   Current Facility-Administered Medications:    DULoxetine  (CYMBALTA) DR capsule 20 mg, 20 mg, Oral, Daily, Terrell Ostrand, Jerlyn Ly, NP   hydrOXYzine (ATARAX) tablet 25 mg, 25 mg, Oral, TID PRN, Shine Mikes, Jerlyn Ly, NP   Melene Muller ON 08/25/2023] lurasidone (LATUDA) tablet 40 mg, 40 mg, Oral, Q breakfast, Alysandra Lobue, Jerlyn Ly, NP  Current Outpatient Medications:    Amantadine HCl 100 MG tablet, Take 50 mg by mouth daily., Disp: , Rfl:    carboxymethylcellulose (REFRESH PLUS) 0.5 %  SOLN, 1 drop 3 (three) times daily as needed., Disp: , Rfl:    cetirizine (ZYRTEC) 10 MG tablet, Take 10 mg by mouth at bedtime., Disp: , Rfl:    chlorhexidine (PERIDEX) 0.12 % solution, Use as directed 15 mLs in the mouth or throat 2 (two) times daily., Disp: , Rfl:    DULoxetine (CYMBALTA) 20 MG capsule, Take 20 mg by mouth daily., Disp: , Rfl:    fluticasone (FLONASE) 50 MCG/ACT nasal spray, Place 1 spray into both nostrils daily., Disp: , Rfl:    hydrOXYzine (ATARAX) 10 MG tablet, Take 10 mg by mouth at bedtime., Disp: , Rfl:    hydrOXYzine (ATARAX) 50 MG tablet, Take 50 mg by mouth at bedtime., Disp: , Rfl:    lurasidone (LATUDA) 20 MG TABS tablet, Take 40 mg by mouth daily., Disp: , Rfl:    Miconazole Nitrate (LOTRIMIN AF) 2 % AERO, Apply 1 application topically 2 (two) times daily., Disp: , Rfl:    terbinafine (LAMISIL) 250 MG tablet, TAKE 1 TABLET BY MOUTH ONCE DAILY. (Patient not taking: Reported on 08/23/2023), Disp: 30 tablet, Rfl: 0  Allergies: Allergies  Allergen Reactions   Bee Venom Anaphylaxis    Juliann Pares, NP

## 2023-08-24 NOTE — ED Notes (Signed)
Hospital meal provided, pt tolerated w/o complaints.  Waste discarded appropriately.  

## 2023-08-24 NOTE — ED Provider Notes (Signed)
Vitals:   08/23/23 0926 08/23/23 2013  BP: 113/77 128/77  Pulse: 75 70  Resp: 18 17  Temp: 98.1 F (36.7 C) 98.2 F (36.8 C)  SpO2: 94% 96%    Patient is currently being recommended for inpatient psychiatric admission per psychiatry note from January 18, however it is noted this note is currently in pended state.  Awaiting further disposition at this time   Sharyn Creamer, MD 08/24/23 [phone]

## 2023-08-24 NOTE — ED Notes (Signed)
[facility] pending placement 

## 2023-08-24 NOTE — ED Notes (Signed)
Pt was accepted to [facility] TODAY 08/24/2023; Bed Assignment [employer].  Pt meets inpatient criteria per Josie Saunders Attending Physician will be Dr. Mollie Germany Report can be called to: - [phone] Pt can arrive after: BED IS READY Care Team notified:Annie Smith,RN, Darlin Priestly [place],LCSWA

## 2023-08-24 NOTE — ED Notes (Signed)
Pt. To BHU from ED ambulatory without difficulty, to room  BHU 3. Report from Gaynell Face RN. Pt. Is alert and oriented, warm and dry in no distress. Pt. Denies SI, HI, and AVH. Pt. Calm and cooperative. Pt. Made aware of security cameras and Q15 minute rounds. Pt. Encouraged to let Nursing staff know of any concerns or needs.   ENVIRONMENTAL ASSESSMENT Potentially harmful objects out of patient reach: Yes.   Personal belongings secured: Yes.   Patient dressed in hospital provided attire only: Yes.   Plastic bags out of patient reach: Yes.   Patient care equipment (cords, cables, call bells, lines, and drains) shortened, removed, or accounted for: Yes.   Equipment and supplies removed from bottom of stretcher: Yes.   Potentially toxic materials out of patient reach: Yes.   Sharps container removed or out of patient reach: Yes.

## 2023-08-24 NOTE — ED Notes (Signed)
Pt taking shower. Pt was given hygiene items and the following, 1 clean top, 1 clean bottom, with 1 pair of disposable underwear.  Pt changed out into clean clothing.  Staff disposed of all shower supplies.   

## 2023-08-25 NOTE — ED Notes (Signed)
Pt taking shower. Pt was given hygiene items and the following, 1 clean top, 1 clean bottom, with 1 pair of disposable underwear.  Pt changed out into clean clothing.  Staff disposed of all shower supplies.   

## 2023-08-25 NOTE — ED Notes (Signed)
Pt provided with lunch tray. Pt sitting up eating.

## 2023-08-25 NOTE — ED Notes (Signed)
Hospital meal provided, pt tolerated w/o complaints.  Waste discarded appropriately.  

## 2023-08-25 NOTE — ED Notes (Signed)
[facility]  DEPT  CALLED  FOR  TRANSPORT  TO  [facility]

## 2023-08-25 NOTE — ED Notes (Signed)
Jonathan Bartlett is A/Ox 3, Mr Jonathan Bartlett continues to endorse SI, he stated that he does not have any current A/V hallucinations.  Inpatient instructions reviewed with Jonathan Bartlett guardian [name] [phone] , who is agreeable and verbalized understanding.  All Belongings accounted for and given to transport [facility].  Jonathan Bartlett left ambulatory via [facility]

## 2023-08-25 NOTE — ED Notes (Signed)
Ivc/Pt was accepted to [facility] on 08/24/2023/ bed ready.

## 2023-08-27 ENCOUNTER — Ambulatory Visit: Payer: MEDICAID | Admitting: Orthopedic Surgery
